# Patient Record
Sex: Female | Born: 1935 | Hispanic: No | Smoking: Never smoker
Health system: Southern US, Community
[De-identification: ages and names within clinical notes are randomized; demographics above are authoritative.]

## PROBLEM LIST (undated history)

## (undated) DIAGNOSIS — M5430 Sciatica, unspecified side: Secondary | ICD-10-CM

## (undated) DIAGNOSIS — G479 Sleep disorder, unspecified: Secondary | ICD-10-CM

## (undated) DIAGNOSIS — M4800 Spinal stenosis, site unspecified: Secondary | ICD-10-CM

## (undated) DIAGNOSIS — E119 Type 2 diabetes mellitus without complications: Secondary | ICD-10-CM

## (undated) DIAGNOSIS — IMO0001 Reserved for inherently not codable concepts without codable children: Secondary | ICD-10-CM

## (undated) DIAGNOSIS — I10 Essential (primary) hypertension: Secondary | ICD-10-CM

## (undated) DIAGNOSIS — K219 Gastro-esophageal reflux disease without esophagitis: Secondary | ICD-10-CM

## (undated) DIAGNOSIS — D649 Anemia, unspecified: Secondary | ICD-10-CM

## (undated) DIAGNOSIS — E785 Hyperlipidemia, unspecified: Secondary | ICD-10-CM

---

## 1979-03-20 HISTORY — PX: ABDOMINAL HYSTERECTOMY: SHX81

## 1999-12-08 ENCOUNTER — Encounter: Payer: Self-pay | Admitting: Family Medicine

## 1999-12-08 ENCOUNTER — Encounter: Admission: RE | Admit: 1999-12-08 | Discharge: 1999-12-08 | Payer: Self-pay | Admitting: Family Medicine

## 2007-10-22 ENCOUNTER — Emergency Department (HOSPITAL_COMMUNITY): Admission: EM | Admit: 2007-10-22 | Discharge: 2007-10-22 | Payer: Self-pay | Admitting: Emergency Medicine

## 2008-10-04 ENCOUNTER — Ambulatory Visit (HOSPITAL_COMMUNITY): Admission: RE | Admit: 2008-10-04 | Discharge: 2008-10-04 | Payer: Self-pay | Admitting: Family Medicine

## 2009-09-15 ENCOUNTER — Encounter: Admission: RE | Admit: 2009-09-15 | Discharge: 2009-09-15 | Payer: Self-pay | Admitting: Family Medicine

## 2009-12-25 ENCOUNTER — Inpatient Hospital Stay (HOSPITAL_COMMUNITY): Admission: EM | Admit: 2009-12-25 | Discharge: 2009-12-27 | Payer: Self-pay | Admitting: Emergency Medicine

## 2010-10-05 LAB — CULTURE, BLOOD (ROUTINE X 2)
Culture: NO GROWTH
Culture: NO GROWTH
Culture: NO GROWTH

## 2010-10-05 LAB — DIFFERENTIAL
Basophils Absolute: 0 10*3/uL (ref 0.0–0.1)
Eosinophils Absolute: 0.1 10*3/uL (ref 0.0–0.7)
Eosinophils Relative: 3 % (ref 0–5)
Lymphocytes Relative: 31 % (ref 12–46)
Lymphocytes Relative: 33 % (ref 12–46)
Lymphs Abs: 2.2 10*3/uL (ref 0.7–4.0)
Lymphs Abs: 2.2 10*3/uL (ref 0.7–4.0)
Monocytes Absolute: 0.6 10*3/uL (ref 0.1–1.0)
Monocytes Relative: 8 % (ref 3–12)
Neutro Abs: 3.7 10*3/uL (ref 1.7–7.7)
Neutro Abs: 4.3 10*3/uL (ref 1.7–7.7)
Neutrophils Relative %: 59 % (ref 43–77)

## 2010-10-05 LAB — COMPREHENSIVE METABOLIC PANEL
AST: 45 U/L — ABNORMAL HIGH (ref 0–37)
Albumin: 3.8 g/dL (ref 3.5–5.2)
BUN: 8 mg/dL (ref 6–23)
Calcium: 9.1 mg/dL (ref 8.4–10.5)
Creatinine, Ser: 0.88 mg/dL (ref 0.4–1.2)
GFR calc Af Amer: >60 mL/min (ref >60)
GFR calc non Af Amer: >60 mL/min (ref >60)
Total Bilirubin: 0.7 mg/dL (ref 0.3–1.2)

## 2010-10-05 LAB — POCT I-STAT, CHEM 8
BUN: 13 mg/dL (ref 6–23)
HCT: 41 % (ref 36.0–46.0)
Sodium: 140 mEq/L (ref 135–145)
TCO2: 28 mmol/L (ref 0–100)

## 2010-10-05 LAB — CBC
HCT: 39.7 % (ref 36.0–46.0)
MCHC: 33.3 g/dL (ref 30.0–36.0)
MCV: 92 fL (ref 78.0–100.0)
MCV: 92.1 fL (ref 78.0–100.0)
Platelets: 274 10*3/uL (ref 150–400)
RBC: 4.27 MIL/uL (ref 3.87–5.11)
RDW: 13.6 % (ref 11.5–15.5)
WBC: 7.2 10*3/uL (ref 4.0–10.5)

## 2010-10-05 LAB — URINALYSIS, ROUTINE W REFLEX MICROSCOPIC
Glucose, UA: NEGATIVE mg/dL
Nitrite: NEGATIVE
Protein, ur: NEGATIVE mg/dL
pH: 6.5 (ref 5.0–8.0)

## 2010-10-05 LAB — MAGNESIUM: Magnesium: 2.1 mg/dL (ref 1.5–2.5)

## 2015-01-10 ENCOUNTER — Other Ambulatory Visit: Payer: Self-pay | Admitting: Physical Medicine and Rehabilitation

## 2015-01-10 DIAGNOSIS — N281 Cyst of kidney, acquired: Secondary | ICD-10-CM

## 2015-01-14 ENCOUNTER — Ambulatory Visit
Admission: RE | Admit: 2015-01-14 | Discharge: 2015-01-14 | Disposition: A | Discharge status: Home / Self Care | Payer: Medicare Other | Source: Ambulatory Visit | Attending: Physical Medicine and Rehabilitation | Admitting: Physical Medicine and Rehabilitation

## 2015-01-14 DIAGNOSIS — N281 Cyst of kidney, acquired: Secondary | ICD-10-CM

## 2015-04-30 ENCOUNTER — Ambulatory Visit: Payer: Self-pay | Admitting: Orthopedic Surgery

## 2015-04-30 NOTE — Progress Notes (Signed)
Needs orders for 10-26 surgery in epic pre op is 10-21 900. thanks

## 2015-05-08 NOTE — Patient Instructions (Addendum)
YOUR PROCEDURE IS SCHEDULED ON : 05/14/15  REPORT TO  HOSPITAL MAIN ENTRANCE FOLLOW SIGNS TO EAST ELEVATOR - GO TO 3rd FLOOR CHECK IN AT 3 EAST NURSES STATION (SHORT STAY) AT:  5:30 AM  CALL THIS NUMBER IF YOU HAVE PROBLEMS THE MORNING OF SURGERY [phone]  REMEMBER:ONLY 1 PER PERSON MAY GO TO SHORT STAY WITH YOU TO GET READY THE MORNING OF YOUR SURGERY  DO NOT EAT FOOD OR DRINK LIQUIDS AFTER MIDNIGHT  TAKE THESE MEDICINES THE MORNING OF SURGERY: NEXIUM  YOU MAY NOT HAVE ANY METAL ON YOUR BODY INCLUDING HAIR PINS AND PIERCING'S. DO NOT WEAR JEWELRY, MAKEUP, LOTIONS, POWDERS OR PERFUMES. DO NOT WEAR NAIL POLISH. DO NOT SHAVE 48 HRS PRIOR TO SURGERY. MEN MAY SHAVE FACE AND NECK.  DO NOT BRING VALUABLES TO HOSPITAL. [name] IS NOT RESPONSIBLE FOR VALUABLES.  CONTACTS, DENTURES OR PARTIALS MAY NOT BE WORN TO SURGERY. LEAVE SUITCASE IN CAR. CAN BE BROUGHT TO ROOM AFTER SURGERY.  PATIENTS DISCHARGED THE DAY OF SURGERY WILL NOT BE ALLOWED TO DRIVE HOME.  PLEASE READ OVER THE FOLLOWING INSTRUCTION SHEETS _________________________________________________________________________________                                          [facility] - PREPARING FOR SURGERY  Before surgery, you can play an important role.  Because skin is not sterile, your skin needs to be as free of germs as possible.  You can reduce the number of germs on your skin by washing with CHG (chlorahexidine gluconate) soap before surgery.  CHG is an antiseptic cleaner which kills germs and bonds with the skin to continue killing germs even after washing. Please DO NOT use if you have an allergy to CHG or antibacterial soaps.  If your skin becomes reddened/irritated stop using the CHG and inform your nurse when you arrive at Short Stay. Do not shave (including legs and underarms) for at least 48 hours prior to the first CHG shower.  You may shave your face. Please follow these instructions  carefully:   1.  Shower with CHG Soap the night before surgery and the  morning of Surgery.   2.  If you choose to wash your hair, wash your hair first as usual with your  normal  Shampoo.   3.  After you shampoo, rinse your hair and body thoroughly to remove the  shampoo.                                         4.  Use CHG as you would any other liquid soap.  You can apply chg directly  to the skin and wash . Gently wash with scrungie or clean wascloth    5.  Apply the CHG Soap to your body ONLY FROM THE NECK DOWN.   Do not use on open                           Wound or open sores. Avoid contact with eyes, ears mouth and genitals (private parts).                        Genitals (private parts) with your normal soap.  6.  Wash thoroughly, paying special attention to the area where your surgery  will be performed.   7.  Thoroughly rinse your body with warm water from the neck down.   8.  DO NOT shower/wash with your normal soap after using and rinsing off  the CHG Soap .                9.  Pat yourself dry with a clean towel.             10.  Wear clean night clothes to bed after shower             11.  Place clean sheets on your bed the night of your first shower and do not  sleep with pets.  Day of Surgery : Do not apply any lotions/deodorants the morning of surgery.  Please wear clean clothes to the hospital/surgery center.  FAILURE TO FOLLOW THESE INSTRUCTIONS MAY RESULT IN THE CANCELLATION OF YOUR SURGERY    PATIENT SIGNATURE_________________________________  ______________________________________________________________________     [name]  An incentive spirometer is a tool that can help keep your lungs clear and active. This tool measures how well you are filling your lungs with each breath. Taking long deep breaths may help reverse or decrease the chance of developing breathing (pulmonary) problems (especially infection) following:  A long  period of time when you are unable to move or be active. BEFORE THE PROCEDURE   If the spirometer includes an indicator to show your best effort, your nurse or respiratory therapist will set it to a desired goal.  If possible, sit up straight or lean slightly forward. Try not to slouch.  Hold the incentive spirometer in an upright position. INSTRUCTIONS FOR USE  1. Sit on the edge of your bed if possible, or sit up as far as you can in bed or on a chair. 2. Hold the incentive spirometer in an upright position. 3. Breathe out normally. 4. Place the mouthpiece in your mouth and seal your lips tightly around it. 5. Breathe in slowly and as deeply as possible, raising the piston or the ball toward the top of the column. 6. Hold your breath for 3-5 seconds or for as long as possible. Allow the piston or ball to fall to the bottom of the column. 7. Remove the mouthpiece from your mouth and breathe out normally. 8. Rest for a few seconds and repeat Steps 1 through 7 at least 10 times every 1-2 hours when you are awake. Take your time and take a few normal breaths between deep breaths. 9. The spirometer may include an indicator to show your best effort. Use the indicator as a goal to work toward during each repetition. 10. After each set of 10 deep breaths, practice coughing to be sure your lungs are clear. If you have an incision (the cut made at the time of surgery), support your incision when coughing by placing a pillow or rolled up towels firmly against it. Once you are able to get out of bed, walk around indoors and cough well. You may stop using the incentive spirometer when instructed by your caregiver.  RISKS AND COMPLICATIONS  Take your time so you do not get dizzy or light-headed.  If you are in pain, you may need to take or ask for pain medication before doing incentive spirometry. It is harder to take a deep breath if you are having pain. AFTER USE  Rest and breathe slowly and  easily.  It can be helpful to keep track of a log of your progress. Your caregiver can provide you with a simple table to help with this. If you are using the spirometer at home, follow these instructions: [place] IF:   You are having difficultly using the spirometer.  You have trouble using the spirometer as often as instructed.  Your pain medication is not giving enough relief while using the spirometer.  You develop fever of 100.5 F (38.1 C) or higher. SEEK IMMEDIATE MEDICAL CARE IF:   You cough up bloody sputum that had not been present before.  You develop fever of 102 F (38.9 C) or greater.  You develop worsening pain at or near the incision site. MAKE SURE YOU:   Understand these instructions.  Will watch your condition.  Will get help right away if you are not doing well or get worse. Document Released: 11/15/2006 Document Revised: 09/27/2011 Document Reviewed: 01/16/2007 [facility] Patient Information 2014 [place], [place].   ________________________________________________________________________

## 2015-05-09 ENCOUNTER — Encounter (HOSPITAL_COMMUNITY)
Admission: RE | Admit: 2015-05-09 | Discharge: 2015-05-09 | Disposition: A | Discharge status: Home / Self Care | Payer: Medicare Other | Source: Ambulatory Visit | Attending: Specialist | Admitting: Specialist

## 2015-05-09 ENCOUNTER — Ambulatory Visit: Payer: Self-pay | Admitting: Orthopedic Surgery

## 2015-05-09 ENCOUNTER — Ambulatory Visit (HOSPITAL_COMMUNITY)
Admission: RE | Admit: 2015-05-09 | Discharge: 2015-05-09 | Disposition: A | Discharge status: Home / Self Care | Payer: Medicare Other | Source: Ambulatory Visit | Attending: Orthopedic Surgery | Admitting: Orthopedic Surgery

## 2015-05-09 ENCOUNTER — Encounter (HOSPITAL_COMMUNITY): Payer: Self-pay

## 2015-05-09 DIAGNOSIS — M8580 Other specified disorders of bone density and structure, unspecified site: Secondary | ICD-10-CM | POA: Diagnosis not present

## 2015-05-09 DIAGNOSIS — M48061 Spinal stenosis, lumbar region without neurogenic claudication: Secondary | ICD-10-CM

## 2015-05-09 DIAGNOSIS — Z01818 Encounter for other preprocedural examination: Secondary | ICD-10-CM | POA: Diagnosis not present

## 2015-05-09 DIAGNOSIS — I700 Atherosclerosis of aorta: Secondary | ICD-10-CM | POA: Insufficient documentation

## 2015-05-09 DIAGNOSIS — Z01812 Encounter for preprocedural laboratory examination: Secondary | ICD-10-CM | POA: Insufficient documentation

## 2015-05-09 DIAGNOSIS — R9431 Abnormal electrocardiogram [ECG] [EKG]: Secondary | ICD-10-CM | POA: Diagnosis not present

## 2015-05-09 DIAGNOSIS — Z0183 Encounter for blood typing: Secondary | ICD-10-CM | POA: Diagnosis not present

## 2015-05-09 DIAGNOSIS — I10 Essential (primary) hypertension: Secondary | ICD-10-CM | POA: Insufficient documentation

## 2015-05-09 DIAGNOSIS — M4806 Spinal stenosis, lumbar region: Secondary | ICD-10-CM | POA: Insufficient documentation

## 2015-05-09 HISTORY — DX: Sleep disorder, unspecified: G47.9

## 2015-05-09 HISTORY — DX: Sciatica, unspecified side: M54.30

## 2015-05-09 HISTORY — DX: Hyperlipidemia, unspecified: E78.5

## 2015-05-09 HISTORY — DX: Type 2 diabetes mellitus without complications: E11.9

## 2015-05-09 HISTORY — DX: Gastro-esophageal reflux disease without esophagitis: K21.9

## 2015-05-09 HISTORY — DX: Reserved for inherently not codable concepts without codable children: IMO0001

## 2015-05-09 HISTORY — DX: Essential (primary) hypertension: I10

## 2015-05-09 HISTORY — DX: Anemia, unspecified: D64.9

## 2015-05-09 HISTORY — DX: Spinal stenosis, site unspecified: M48.00

## 2015-05-09 LAB — SURGICAL PCR SCREEN
MRSA, PCR: NEGATIVE
STAPHYLOCOCCUS AUREUS: NEGATIVE

## 2015-05-09 LAB — BASIC METABOLIC PANEL
ANION GAP: 9 (ref 5–15)
BUN: 11 mg/dL (ref 6–20)
CHLORIDE: 103 mmol/L (ref 101–111)
CO2: 26 mmol/L (ref 22–32)
Calcium: 9.8 mg/dL (ref 8.9–10.3)
Creatinine, Ser: 0.75 mg/dL (ref 0.44–1.00)
GFR calc non Af Amer: >60 mL/min (ref >60)
Glucose, Bld: 116 mg/dL — ABNORMAL HIGH (ref 65–99)
POTASSIUM: 4 mmol/L (ref 3.5–5.1)
SODIUM: 138 mmol/L (ref 135–145)

## 2015-05-09 LAB — ABO/RH: ABO/RH(D): O POS

## 2015-05-09 NOTE — Progress Notes (Signed)
Pt states she was told to "STOP ALL HER MEDICINE A WEEK BEFORE SURGERY" Pt has stopped all meds since 05/06/15  In told pt she needs to continue her Rx meds until day of surgery BMET repeated due to pt not taking metformin x3 days

## 2015-05-09 NOTE — H&P (Signed)
[name] is an 81 y.o. female.   Chief Complaint: back and leg pain HPI: The patient is a 81 year old female who presents with back pain. The patient is here today in referral from Dr. Nelva Bush. The patient reports low back symptoms including pain which began 8 month(s) ago without any known injury. Symptoms are reported to be located in the right low back and Symptoms include pain. The pain radiates to the right buttock, right lateral thigh and right lateral lower leg. The patient describes the pain as aching. The patient describes the severity of their symptoms as 5 / 10. Symptoms are exacerbated by sitting. Symptoms are relieved by recumbency. Current treatment includes nonsteroidal anti-inflammatory drugs (Aleve prn and Gabapentin). Prior to being seen today the patient was previously evaluated by Dr. Delilah Mendez and Dr. Nelva Bush. Past evaluation has included x-ray of the lumbar spine and MRI of the lumbar spine. Past treatment has included nonsteroidal anti-inflammatory drugs, epidural injections (@ L5-S1 right and SNRB L3, L4 right), activity modification, physical therapy, ice packs and heating pad.  CHIEF COMPLAINT Right lower extremity radicular pain, numbness and weakness.  HISTORY OF PRESENT ILLNESS This is a pleasant female, kindly referred by Dr. Nelva Bush for evaluation of the above mentioned symptoms, a 81 year old old. She reports a one year's duration of pain that radiates down into the dorsum of her foot. It is affecting her ability to stand, sit, walk, sleep. She has had an MRI back in June, which indicated stenosis sphering the lateral recess at 4-5 and moderate at 3-4. There is a minimal listhesis of 3 on 4. She had an epidural, no lasting relief. She has undergone physical therapy and has reported weakness and numbness.  The patient's pain drawings are organic and rates the pain currently as 5/10.  Past Medical History  Diagnosis Date  . Hypertension   . Hyperlipidemia   . Shortness of  breath dyspnea     with exertion  . Spinal stenosis   . Sciatic pain   . GERD (gastroesophageal reflux disease)   . Anemia   . Diabetes mellitus without complication ([place])   . Difficulty sleeping     Past Surgical History  Procedure Laterality Date  . Abdominal hysterectomy  1980's    No family history on file. Social History:  reports that she has never smoked. She does not have any smokeless tobacco history on file. She reports that she does not drink alcohol or use illicit drugs.  Allergies: No Known Allergies   (Not in a hospital admission)  Results for orders placed or performed during the hospital encounter of 05/09/15 (from the past 48 hour(s))  Surgical pcr screen     Status: None   Collection Time: 05/09/15 10:05 AM  Result Value Ref Range   MRSA, PCR NEGATIVE NEGATIVE   Staphylococcus aureus NEGATIVE NEGATIVE    Comment:        The Xpert SA Assay (FDA approved for NASAL specimens in patients over 43 years of age), is one component of a comprehensive surveillance program.  Test performance has been validated by [facility] for patients greater than or equal to 93 year old. It is not intended to diagnose infection nor to guide or monitor treatment.   Basic metabolic panel     Status: Abnormal   Collection Time: 05/09/15 10:30 AM  Result Value Ref Range   Sodium 138 135 - 145 mmol/L   Potassium 4.0 3.5 - 5.1 mmol/L   Chloride 103 101 -  111 mmol/L   CO2 26 22 - 32 mmol/L   Glucose, Bld 116 (H) 65 - 99 mg/dL   BUN 11 6 - 20 mg/dL   Creatinine, Ser 0.75 0.44 - 1.00 mg/dL   Calcium 9.8 8.9 - 10.3 mg/dL   GFR calc non Af Amer >60 >60 mL/min   GFR calc Af Amer >60 >60 mL/min    Comment: (NOTE) The eGFR has been calculated using the CKD EPI equation. This calculation has not been validated in all clinical situations. eGFR's persistently <60 mL/min signify possible Chronic Kidney Disease.    Anion gap 9 5 - 15  Type and screen Order type and screen if day  of surgery is less than 15 days from draw of preadmission visit or order morning of surgery if day of surgery is greater than 6 days from preadmission visit.     Status: None   Collection Time: 05/09/15 10:30 AM  Result Value Ref Range   ABO/RH(D) O POS    Antibody Screen NEG    Sample Expiration 05/23/2015   ABO/Rh     Status: None   Collection Time: 05/09/15 10:30 AM  Result Value Ref Range   ABO/RH(D) O POS    Dg Lumbar Spine 2-3 Views  05/09/2015  CLINICAL DATA:  Lumbar spine surgery. EXAM: LUMBAR SPINE - 2-3 VIEW COMPARISON:  CT 12/25/2009. FINDINGS: Diffuse osteopenia. Diffuse multilevel degenerative change. No acute bony abnormality. Normal alignment. Aortoiliac atherosclerotic vascular disease . IMPRESSION: 1. Diffuse degenerative change lumbar spine. No acute abnormality. Normal alignment. 2. Aortoiliac atherosclerotic vascular disease. Electronically Signed   By: Marcello Moores  Register   On: 05/09/2015 12:36    Review of Systems  Constitutional: Negative.   HENT: Negative.   Eyes: Negative.   Respiratory: Negative.   Cardiovascular: Negative.   Gastrointestinal: Negative.   Genitourinary: Negative.   Musculoskeletal: Positive for back pain.  Skin: Negative.   Neurological: Positive for sensory change and focal weakness.    There were no vitals taken for this visit. Physical Exam  Constitutional: She is oriented to person, place, and time. She appears well-developed.  HENT:  Head: Normocephalic.  Eyes: Pupils are equal, round, and reactive to light.  Neck: Normal range of motion.  Cardiovascular: Normal rate.   GI: Soft.  Musculoskeletal:  A healthy female, in moderate distress, sitting and leaning to her left. Straight leg raise, buttock, thigh and calf pain on the right, negative on the left, 4/5 EHL on the right compared to the left, decreased sensation at L5 dermatome, perhaps slightly decreased knee reflex on the right compared to the left. Pain with extension and  flexion of the lumbar spine.  Lumbar spine exam reveals no evidence of soft tissue swelling, deformity or skin ecchymosis. On palpation there is no tenderness of the lumbar spine. No flank pain with percussion. The abdomen is soft and nontender. Nontender over the trochanters. No cellulitis or lymphadenopathy.  Good range of motion of the lumbar spine without associated pain. Motor is 5/5 including tibialis anterior, plantar flexion, quadriceps and hamstrings. Patient is normoreflexic. There is no Babinski or clonus. Patient has good distal pulses. No DVT. No pain and normal range of motion without instability of the hips, knees and ankles.  Neurological: She is alert and oriented to person, place, and time. She has normal reflexes.  Skin: Skin is warm and dry.    Three-view radiographs of the lumbar spine demonstrate disc degeneration at the 3-4 and at 4-5. There is a minimal  listhesis at 3-4 without instability in flexion and extension, perhaps 1 mm. Osteopenia is noted. Hips are unremarkable.  Assessment/Plan 1. 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 2. Non-insulin-dependent diabetes.  Extensive discussion with Mataya and her daughter concerning current pathology, relevant anatomy and treatment options. Certainly, she is significantly symptomatic from her stenosis, predominantly at L5 nerve root distribution. Decompression at 4-5, possibly at 3-4 will be appropriate. I had an extensive discussion of the risks and benefits of the lumbar decompression with the patient including bleeding, infection, damage to neurovascular structures, epidural fibrosis, CSF leak requiring repair. We also discussed increase in pain, adjacent segment disease, recurrent disc herniation, need for future surgery including repeat decompression and/or fusion. We also discussed risks  of postoperative hematoma, paralysis, anesthetic complications including DVT, PE, death, cardiopulmonary dysfunction. In addition, the perioperative and postoperative courses were discussed in detail including the rehabilitative time and return to functional activity and work. I provided the patient with an illustrated handout and utilized the appropriate surgical models. Her diabetes is well controlled, no history of MRSA, DVT, stroke or MI. She is currently taking gabapentin, which is appropriate. We discussed overnight in the hospital, sutures removed in two weeks and full recovery in eight-week period of time following formal supervised physical therapy. I do feel it is reasonable to offer this at this point in time for Ms. Balthazar. She is significantly disabled by her symptoms. I kindly appreciates the referral by Dr. Nelva Bush and we will proceed accordingly. 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  Plan microlumbar decompression L4-5 possible L3-4  BISSELL, JACLYN M. PA-C for Dr. Tonita Cong 05/09/2015, 3:28 PM

## 2015-05-14 ENCOUNTER — Ambulatory Visit (HOSPITAL_COMMUNITY): Payer: Medicare Other

## 2015-05-14 ENCOUNTER — Encounter (HOSPITAL_COMMUNITY)
Admission: RE | Disposition: A | Discharge status: Skilled Nursing Facility | Payer: Self-pay | Source: Ambulatory Visit | Attending: Specialist

## 2015-05-14 ENCOUNTER — Encounter (HOSPITAL_COMMUNITY): Payer: Self-pay | Admitting: *Deleted

## 2015-05-14 ENCOUNTER — Ambulatory Visit (HOSPITAL_COMMUNITY)
Admission: RE | Admit: 2015-05-14 | Discharge: 2015-05-19 | Disposition: A | Discharge status: Skilled Nursing Facility | Payer: Medicare Other | Source: Ambulatory Visit | Attending: Specialist | Admitting: Specialist

## 2015-05-14 ENCOUNTER — Ambulatory Visit (HOSPITAL_COMMUNITY): Payer: Medicare Other | Admitting: Certified Registered Nurse Anesthetist

## 2015-05-14 DIAGNOSIS — E119 Type 2 diabetes mellitus without complications: Secondary | ICD-10-CM | POA: Insufficient documentation

## 2015-05-14 DIAGNOSIS — M25551 Pain in right hip: Secondary | ICD-10-CM | POA: Diagnosis not present

## 2015-05-14 DIAGNOSIS — Z419 Encounter for procedure for purposes other than remedying health state, unspecified: Secondary | ICD-10-CM

## 2015-05-14 DIAGNOSIS — E785 Hyperlipidemia, unspecified: Secondary | ICD-10-CM | POA: Insufficient documentation

## 2015-05-14 DIAGNOSIS — I10 Essential (primary) hypertension: Secondary | ICD-10-CM | POA: Insufficient documentation

## 2015-05-14 DIAGNOSIS — M4806 Spinal stenosis, lumbar region: Secondary | ICD-10-CM | POA: Insufficient documentation

## 2015-05-14 DIAGNOSIS — K219 Gastro-esophageal reflux disease without esophagitis: Secondary | ICD-10-CM | POA: Insufficient documentation

## 2015-05-14 DIAGNOSIS — M48061 Spinal stenosis, lumbar region without neurogenic claudication: Secondary | ICD-10-CM

## 2015-05-14 HISTORY — PX: LUMBAR LAMINECTOMY/DECOMPRESSION MICRODISCECTOMY: SHX5026

## 2015-05-14 LAB — TYPE AND SCREEN
ABO/RH(D): O POS
Antibody Screen: NEGATIVE

## 2015-05-14 LAB — GLUCOSE, CAPILLARY
GLUCOSE-CAPILLARY: 228 mg/dL — AB (ref 65–99)
Glucose-Capillary: 122 mg/dL — ABNORMAL HIGH (ref 65–99)
Glucose-Capillary: 149 mg/dL — ABNORMAL HIGH (ref 65–99)
Glucose-Capillary: 161 mg/dL — ABNORMAL HIGH (ref 65–99)

## 2015-05-14 SURGERY — LUMBAR LAMINECTOMY/DECOMPRESSION MICRODISCECTOMY 2 LEVELS
Anesthesia: General | Site: Back

## 2015-05-14 MED ORDER — CEFAZOLIN SODIUM-DEXTROSE 2-3 GM-% IV SOLR
2.0000 g | Freq: Three times a day (TID) | INTRAVENOUS | Status: AC
  Administered 2015-05-14 – 2015-05-15 (×3): 2 g via INTRAVENOUS
  Filled 2015-05-14 (×3): qty 50

## 2015-05-14 MED ORDER — FENTANYL CITRATE (PF) 100 MCG/2ML IJ SOLN
INTRAMUSCULAR | Status: AC
  Filled 2015-05-14: qty 4

## 2015-05-14 MED ORDER — EPHEDRINE SULFATE 50 MG/ML IJ SOLN
INTRAMUSCULAR | Status: AC
  Filled 2015-05-14: qty 1

## 2015-05-14 MED ORDER — SUGAMMADEX SODIUM 500 MG/5ML IV SOLN: INTRAVENOUS | Status: DC | PRN

## 2015-05-14 MED ORDER — HYDROCODONE-ACETAMINOPHEN 5-325 MG PO TABS
1.0000 | ORAL_TABLET | ORAL | Status: DC | PRN
  Administered 2015-05-14: 1 via ORAL
  Administered 2015-05-14 (×2): 2 via ORAL
  Administered 2015-05-15 – 2015-05-16 (×5): 1 via ORAL
  Administered 2015-05-16: 2 via ORAL
  Administered 2015-05-16: 1 via ORAL
  Administered 2015-05-17 – 2015-05-19 (×9): 2 via ORAL
  Filled 2015-05-14: qty 1
  Filled 2015-05-14: qty 2
  Filled 2015-05-14: qty 1
  Filled 2015-05-14 (×4): qty 2
  Filled 2015-05-14: qty 1
  Filled 2015-05-14: qty 2
  Filled 2015-05-14 (×2): qty 1
  Filled 2015-05-14 (×7): qty 2

## 2015-05-14 MED ORDER — ROCURONIUM BROMIDE 100 MG/10ML IV SOLN
INTRAVENOUS | Status: AC
  Filled 2015-05-14: qty 1

## 2015-05-14 MED ORDER — THROMBIN 5000 UNITS EX SOLR
CUTANEOUS | Status: AC
  Filled 2015-05-14: qty 10000

## 2015-05-14 MED ORDER — DOCUSATE SODIUM 100 MG PO CAPS: 100.0000 mg | ORAL_CAPSULE | Freq: Two times a day (BID) | ORAL | Status: DC | PRN

## 2015-05-14 MED ORDER — POTASSIUM CHLORIDE IN NACL 20-0.9 MEQ/L-% IV SOLN
INTRAVENOUS | Status: AC
  Administered 2015-05-14: 12:00:00 via INTRAVENOUS
  Filled 2015-05-14 (×2): qty 1000

## 2015-05-14 MED ORDER — ONDANSETRON HCL 4 MG/2ML IJ SOLN
INTRAMUSCULAR | Status: DC | PRN
  Administered 2015-05-14 (×2): 2 mg via INTRAVENOUS

## 2015-05-14 MED ORDER — LIDOCAINE HCL (CARDIAC) 20 MG/ML IV SOLN
INTRAVENOUS | Status: AC
  Filled 2015-05-14: qty 5

## 2015-05-14 MED ORDER — PROPOFOL 10 MG/ML IV BOLUS
INTRAVENOUS | Status: AC
  Filled 2015-05-14: qty 20

## 2015-05-14 MED ORDER — ALUM & MAG HYDROXIDE-SIMETH 200-200-20 MG/5ML PO SUSP: 30.0000 mL | Freq: Four times a day (QID) | ORAL | Status: DC | PRN

## 2015-05-14 MED ORDER — CEFAZOLIN SODIUM-DEXTROSE 2-3 GM-% IV SOLR
INTRAVENOUS | Status: AC
Start: 2015-05-14 — End: 2015-05-14
  Filled 2015-05-14: qty 50

## 2015-05-14 MED ORDER — INSULIN ASPART 100 UNIT/ML ~~LOC~~ SOLN
0.0000 [IU] | Freq: Three times a day (TID) | SUBCUTANEOUS | Status: DC
  Administered 2015-05-14: 2 [IU] via SUBCUTANEOUS
  Administered 2015-05-14 – 2015-05-15 (×2): 3 [IU] via SUBCUTANEOUS

## 2015-05-14 MED ORDER — FENTANYL CITRATE (PF) 100 MCG/2ML IJ SOLN
INTRAMUSCULAR | Status: DC | PRN
  Administered 2015-05-14: 25 ug via INTRAVENOUS
  Administered 2015-05-14 (×2): 50 ug via INTRAVENOUS
  Administered 2015-05-14: 25 ug via INTRAVENOUS

## 2015-05-14 MED ORDER — MENTHOL 3 MG MT LOZG: 1.0000 | LOZENGE | OROMUCOSAL | Status: DC | PRN

## 2015-05-14 MED ORDER — ACETAMINOPHEN 325 MG PO TABS
650.0000 mg | ORAL_TABLET | ORAL | Status: DC | PRN
  Administered 2015-05-15: 650 mg via ORAL
  Filled 2015-05-14: qty 2

## 2015-05-14 MED ORDER — FENTANYL CITRATE (PF) 100 MCG/2ML IJ SOLN
25.0000 ug | INTRAMUSCULAR | Status: DC | PRN
  Administered 2015-05-14 (×2): 50 ug via INTRAVENOUS

## 2015-05-14 MED ORDER — ACETAMINOPHEN 650 MG RE SUPP: 650.0000 mg | RECTAL | Status: DC | PRN

## 2015-05-14 MED ORDER — MIDAZOLAM HCL 2 MG/2ML IJ SOLN
INTRAMUSCULAR | Status: AC
  Filled 2015-05-14: qty 4

## 2015-05-14 MED ORDER — METHOCARBAMOL 500 MG PO TABS
500.0000 mg | ORAL_TABLET | Freq: Four times a day (QID) | ORAL | Status: DC | PRN
  Administered 2015-05-14 – 2015-05-19 (×12): 500 mg via ORAL
  Filled 2015-05-14 (×11): qty 1

## 2015-05-14 MED ORDER — SENNOSIDES-DOCUSATE SODIUM 8.6-50 MG PO TABS: 1.0000 | ORAL_TABLET | Freq: Every evening | ORAL | Status: DC | PRN

## 2015-05-14 MED ORDER — HYDROMORPHONE HCL 1 MG/ML IJ SOLN
INTRAMUSCULAR | Status: AC
  Filled 2015-05-14: qty 1

## 2015-05-14 MED ORDER — HYDROMORPHONE HCL 1 MG/ML IJ SOLN
0.5000 mg | INTRAMUSCULAR | Status: DC | PRN
  Administered 2015-05-14: 1 mg via INTRAVENOUS
  Filled 2015-05-14: qty 1

## 2015-05-14 MED ORDER — DEXAMETHASONE SODIUM PHOSPHATE 10 MG/ML IJ SOLN
INTRAMUSCULAR | Status: DC | PRN
  Administered 2015-05-14: 10 mg via INTRAVENOUS

## 2015-05-14 MED ORDER — THROMBIN 5000 UNITS EX SOLR
CUTANEOUS | Status: DC | PRN
  Administered 2015-05-14: 2 [IU] via TOPICAL

## 2015-05-14 MED ORDER — SODIUM CHLORIDE 0.9 % IJ SOLN
INTRAMUSCULAR | Status: AC
  Filled 2015-05-14: qty 10

## 2015-05-14 MED ORDER — LACTATED RINGERS IV SOLN
INTRAVENOUS | Status: DC
  Administered 2015-05-14 (×3): via INTRAVENOUS

## 2015-05-14 MED ORDER — HYDROCODONE-ACETAMINOPHEN 7.5-325 MG PO TABS: 1.0000 | ORAL_TABLET | ORAL | Status: DC | PRN

## 2015-05-14 MED ORDER — DEXAMETHASONE SODIUM PHOSPHATE 10 MG/ML IJ SOLN
INTRAMUSCULAR | Status: AC
  Filled 2015-05-14: qty 1

## 2015-05-14 MED ORDER — OXYCODONE-ACETAMINOPHEN 5-325 MG PO TABS
1.0000 | ORAL_TABLET | ORAL | Status: DC | PRN
  Administered 2015-05-18 – 2015-05-19 (×2): 2 via ORAL
  Filled 2015-05-14 (×2): qty 2

## 2015-05-14 MED ORDER — HYDROMORPHONE HCL 1 MG/ML IJ SOLN
0.2500 mg | INTRAMUSCULAR | Status: DC | PRN
  Administered 2015-05-14 (×2): 0.5 mg via INTRAVENOUS

## 2015-05-14 MED ORDER — PHENOL 1.4 % MT LIQD: 1.0000 | OROMUCOSAL | Status: DC | PRN

## 2015-05-14 MED ORDER — PHENYLEPHRINE HCL 10 MG/ML IJ SOLN
INTRAMUSCULAR | Status: DC | PRN
  Administered 2015-05-14 (×2): 40 ug via INTRAVENOUS
  Administered 2015-05-14 (×3): 20 ug via INTRAVENOUS

## 2015-05-14 MED ORDER — CEFAZOLIN SODIUM-DEXTROSE 2-3 GM-% IV SOLR
2.0000 g | INTRAVENOUS | Status: AC
  Administered 2015-05-14: 2 g via INTRAVENOUS

## 2015-05-14 MED ORDER — ROCURONIUM BROMIDE 100 MG/10ML IV SOLN
INTRAVENOUS | Status: DC | PRN
  Administered 2015-05-14: 35 mg via INTRAVENOUS
  Administered 2015-05-14: 5 mg via INTRAVENOUS

## 2015-05-14 MED ORDER — MIDAZOLAM HCL 5 MG/5ML IJ SOLN
INTRAMUSCULAR | Status: DC | PRN
  Administered 2015-05-14 (×2): 0.5 mg via INTRAVENOUS

## 2015-05-14 MED ORDER — PROMETHAZINE HCL 25 MG/ML IJ SOLN: 6.2500 mg | INTRAMUSCULAR | Status: DC | PRN

## 2015-05-14 MED ORDER — BUPIVACAINE-EPINEPHRINE (PF) 0.5% -1:200000 IJ SOLN
INTRAMUSCULAR | Status: DC | PRN
  Administered 2015-05-14: 10 mL

## 2015-05-14 MED ORDER — SUGAMMADEX SODIUM 500 MG/5ML IV SOLN
INTRAVENOUS | Status: DC | PRN
  Administered 2015-05-14: 200 mg via INTRAVENOUS

## 2015-05-14 MED ORDER — RISAQUAD PO CAPS
1.0000 | ORAL_CAPSULE | Freq: Every day | ORAL | Status: DC
  Administered 2015-05-14 – 2015-05-19 (×6): 1 via ORAL
  Filled 2015-05-14 (×6): qty 1

## 2015-05-14 MED ORDER — GABAPENTIN 100 MG PO CAPS
100.0000 mg | ORAL_CAPSULE | Freq: Three times a day (TID) | ORAL | Status: DC
  Administered 2015-05-14 – 2015-05-19 (×16): 100 mg via ORAL
  Filled 2015-05-14 (×18): qty 1

## 2015-05-14 MED ORDER — MEPERIDINE HCL 50 MG/ML IJ SOLN: 6.2500 mg | INTRAMUSCULAR | Status: DC | PRN

## 2015-05-14 MED ORDER — POLYMYXIN B SULFATE 500000 UNITS IJ SOLR
INTRAMUSCULAR | Status: DC | PRN
  Administered 2015-05-14: 500 mL

## 2015-05-14 MED ORDER — DEXTROSE 5 % IV SOLN
500.0000 mg | Freq: Four times a day (QID) | INTRAVENOUS | Status: DC | PRN
  Administered 2015-05-14: 500 mg via INTRAVENOUS
  Filled 2015-05-14 (×2): qty 5

## 2015-05-14 MED ORDER — FENTANYL CITRATE (PF) 100 MCG/2ML IJ SOLN
INTRAMUSCULAR | Status: AC
  Filled 2015-05-14: qty 2

## 2015-05-14 MED ORDER — PHENYLEPHRINE HCL 10 MG/ML IJ SOLN
INTRAMUSCULAR | Status: AC
  Filled 2015-05-14: qty 1

## 2015-05-14 MED ORDER — HYDROCHLOROTHIAZIDE 25 MG PO TABS
25.0000 mg | ORAL_TABLET | Freq: Every day | ORAL | Status: DC
  Administered 2015-05-15 – 2015-05-19 (×5): 25 mg via ORAL
  Filled 2015-05-14 (×5): qty 1

## 2015-05-14 MED ORDER — BISACODYL 5 MG PO TBEC: 5.0000 mg | DELAYED_RELEASE_TABLET | Freq: Every day | ORAL | Status: DC | PRN

## 2015-05-14 MED ORDER — PROPOFOL 10 MG/ML IV BOLUS
INTRAVENOUS | Status: DC | PRN
  Administered 2015-05-14: 25 mg via INTRAVENOUS
  Administered 2015-05-14: 150 mg via INTRAVENOUS

## 2015-05-14 MED ORDER — DOCUSATE SODIUM 100 MG PO CAPS
100.0000 mg | ORAL_CAPSULE | Freq: Two times a day (BID) | ORAL | Status: DC
  Administered 2015-05-15 – 2015-05-19 (×7): 100 mg via ORAL

## 2015-05-14 MED ORDER — LIDOCAINE HCL (CARDIAC) 20 MG/ML IV SOLN
INTRAVENOUS | Status: DC | PRN
  Administered 2015-05-14: 50 mg via INTRAVENOUS

## 2015-05-14 MED ORDER — MAGNESIUM CITRATE PO SOLN: 1.0000 | Freq: Once | ORAL | Status: DC | PRN

## 2015-05-14 MED ORDER — ONDANSETRON HCL 4 MG/2ML IJ SOLN
INTRAMUSCULAR | Status: AC
  Filled 2015-05-14: qty 2

## 2015-05-14 MED ORDER — SUGAMMADEX SODIUM 200 MG/2ML IV SOLN
INTRAVENOUS | Status: AC
  Filled 2015-05-14: qty 2

## 2015-05-14 MED ORDER — PANTOPRAZOLE SODIUM 40 MG PO TBEC
40.0000 mg | DELAYED_RELEASE_TABLET | Freq: Every day | ORAL | Status: DC
  Administered 2015-05-15 – 2015-05-19 (×5): 40 mg via ORAL
  Filled 2015-05-14 (×5): qty 1

## 2015-05-14 MED ORDER — ONDANSETRON HCL 4 MG/2ML IJ SOLN: 4.0000 mg | INTRAMUSCULAR | Status: DC | PRN

## 2015-05-14 MED ORDER — SODIUM CHLORIDE 0.9 % IR SOLN
Status: AC
  Filled 2015-05-14: qty 1

## 2015-05-14 MED ORDER — BUPIVACAINE-EPINEPHRINE (PF) 0.5% -1:200000 IJ SOLN
INTRAMUSCULAR | Status: AC
Start: 2015-05-14 — End: 2015-05-14
  Filled 2015-05-14: qty 30

## 2015-05-14 SURGICAL SUPPLY — 51 items
BAG SPEC THK2 15X12 ZIP CLS (MISCELLANEOUS)
BAG ZIPLOCK 12X15 (MISCELLANEOUS) IMPLANT
CLEANER TIP ELECTROSURG 2X2 (MISCELLANEOUS) ×3 IMPLANT
CLOSURE WOUND 1/2 X4 (GAUZE/BANDAGES/DRESSINGS)
CLOTH 2% CHLOROHEXIDINE 3PK (PERSONAL CARE ITEMS) ×3 IMPLANT
DRAPE MICROSCOPE LEICA (MISCELLANEOUS) ×3 IMPLANT
DRAPE SHEET LG 3/4 BI-LAMINATE (DRAPES) ×3 IMPLANT
DRAPE SURG 17X11 SM STRL (DRAPES) ×3 IMPLANT
DRAPE UTILITY XL STRL (DRAPES) ×3 IMPLANT
DRSG AQUACEL AG ADV 3.5X 4 (GAUZE/BANDAGES/DRESSINGS) IMPLANT
DRSG AQUACEL AG ADV 3.5X 6 (GAUZE/BANDAGES/DRESSINGS) ×2 IMPLANT
DURAPREP 26ML APPLICATOR (WOUND CARE) ×3 IMPLANT
DURASEAL SPINE SEALANT 3ML (MISCELLANEOUS) IMPLANT
ELECT BLADE TIP CTD 4 INCH (ELECTRODE) ×2 IMPLANT
ELECT REM PT RETURN 9FT ADLT (ELECTROSURGICAL) ×3
ELECTRODE REM PT RTRN 9FT ADLT (ELECTROSURGICAL) ×1 IMPLANT
GLOVE BIOGEL PI IND STRL 7.0 (GLOVE) ×1 IMPLANT
GLOVE BIOGEL PI INDICATOR 7.0 (GLOVE) ×2
GLOVE SURG SS PI 7.0 STRL IVOR (GLOVE) ×3 IMPLANT
GLOVE SURG SS PI 7.5 STRL IVOR (GLOVE) ×3 IMPLANT
GLOVE SURG SS PI 8.0 STRL IVOR (GLOVE) ×6 IMPLANT
GOWN STRL REUS W/TWL XL LVL3 (GOWN DISPOSABLE) ×6 IMPLANT
IV CATH 14GX2 1/4 (CATHETERS) IMPLANT
KIT BASIN OR (CUSTOM PROCEDURE TRAY) ×3 IMPLANT
KIT POSITIONING SURG ANDREWS (MISCELLANEOUS) ×3 IMPLANT
MANIFOLD NEPTUNE II (INSTRUMENTS) ×3 IMPLANT
NDL SPNL 18GX3.5 QUINCKE PK (NEEDLE) ×2 IMPLANT
NEEDLE SPNL 18GX3.5 QUINCKE PK (NEEDLE) ×6 IMPLANT
PACK LAMINECTOMY ORTHO (CUSTOM PROCEDURE TRAY) ×3 IMPLANT
PATTIES SURGICAL .5 X.5 (GAUZE/BANDAGES/DRESSINGS) ×1 IMPLANT
PATTIES SURGICAL .75X.75 (GAUZE/BANDAGES/DRESSINGS) ×3 IMPLANT
PATTIES SURGICAL 1X1 (DISPOSABLE) IMPLANT
PEN SKIN MARKING BROAD (MISCELLANEOUS) ×3 IMPLANT
RUBBERBAND STERILE (MISCELLANEOUS) ×3 IMPLANT
SPONGE LAP 4X18 X RAY DECT (DISPOSABLE) ×2 IMPLANT
SPONGE SURGIFOAM ABS GEL 100 (HEMOSTASIS) ×3 IMPLANT
STAPLER VISISTAT (STAPLE) ×4 IMPLANT
STRIP CLOSURE SKIN 1/2X4 (GAUZE/BANDAGES/DRESSINGS) IMPLANT
SUT NURALON 4 0 TR CR/8 (SUTURE) IMPLANT
SUT PROLENE 3 0 PS 2 (SUTURE) IMPLANT
SUT VIC AB 1 CT1 27 (SUTURE)
SUT VIC AB 1 CT1 27XBRD ANTBC (SUTURE) IMPLANT
SUT VIC AB 1-0 CT2 27 (SUTURE) IMPLANT
SUT VIC AB 2-0 CT1 27 (SUTURE)
SUT VIC AB 2-0 CT1 TAPERPNT 27 (SUTURE) IMPLANT
SUT VIC AB 2-0 CT2 27 (SUTURE) IMPLANT
SYR 3ML LL SCALE MARK (SYRINGE) IMPLANT
TOWEL OR 17X26 10 PK STRL BLUE (TOWEL DISPOSABLE) ×3 IMPLANT
TOWEL OR NON WOVEN STRL DISP B (DISPOSABLE) ×3 IMPLANT
TRAY FOLEY W/METER SILVER 14FR (SET/KITS/TRAYS/PACK) ×2 IMPLANT
YANKAUER SUCT BULB TIP NO VENT (SUCTIONS) ×3 IMPLANT

## 2015-05-14 NOTE — Anesthesia Preprocedure Evaluation (Addendum)
Anesthesia Evaluation  Patient identified by MRN, date of birth, ID band Patient awake    Reviewed: Allergy & Precautions, NPO status , Patient's Chart, lab work & pertinent test results  Airway Mallampati: II  TM Distance: >3 FB Neck ROM: Full    Dental no notable dental hx. (+) Edentulous Upper, Edentulous Lower   Pulmonary neg pulmonary ROS,    Pulmonary exam normal breath sounds clear to auscultation       Cardiovascular hypertension, Pt. on medications Normal cardiovascular exam Rhythm:Regular Rate:Normal     Neuro/Psych negative neurological ROS  negative psych ROS   GI/Hepatic negative GI ROS, Neg liver ROS,   Endo/Other  diabetes, Type 2, Oral Hypoglycemic Agents  Renal/GU negative Renal ROS  negative genitourinary   Musculoskeletal negative musculoskeletal ROS (+)   Abdominal   Peds negative pediatric ROS (+)  Hematology negative hematology ROS (+)   Anesthesia Other Findings   Reproductive/Obstetrics negative OB ROS                            Anesthesia Physical Anesthesia Plan  ASA: III  Anesthesia Plan: General   Post-op Pain Management:    Induction: Intravenous  Airway Management Planned: Oral ETT  Additional Equipment:   Intra-op Plan:   Post-operative Plan: Extubation in OR  Informed Consent: I have reviewed the patients History and Physical, chart, labs and discussed the procedure including the risks, benefits and alternatives for the proposed anesthesia with the patient or authorized representative who has indicated his/her understanding and acceptance.   Dental advisory given  Plan Discussed with: CRNA  Anesthesia Plan Comments:         Anesthesia Quick Evaluation

## 2015-05-14 NOTE — Evaluation (Signed)
Physical Therapy Evaluation Patient Details Name: [name] MRN: [id number] DOB: 1933/12/17 Today's Date: 05/14/2015   History of Present Illness  MICRO LUMBAR DECOMPRESSION L4-L5 POSSIBLE L3-L4 ( 2 LEVELS) 10-26  Clinical Impression  Patient  Tolerated mobilizing to edge of bed, required 2 persons for safe anmbulation  X 15'. C/o R htigh spasms. Patient will benefit from PT to address problems listed in the note below.    Follow Up Recommendations Home health PT;Supervision/Assistance - 24 hour    Equipment Recommendations  Rolling walker with 5" wheels;3in1 (PT)    Recommendations for Other Services       Precautions / Restrictions Precautions Precautions: Back      Mobility  Bed Mobility Overal bed mobility: Needs Assistance;+ 2 for safety/equipment Bed Mobility: Rolling;Sidelying to Sit;Sit to Sidelying Rolling: Mod assist Sidelying to sit: Max assist     Sit to sidelying: Mod assist;HOB elevated General bed mobility comments: cues for bend knees and og roll, bed pad used to roll. assist for trunk to upright, asist with legs onto bed and trunk to sidelying.  Transfers Overall transfer level: Needs assistance Equipment used: Rolling walker (2 wheeled) Transfers: Sit to/from Stand Sit to Stand: +2 physical assistance;+2 safety/equipment;From elevated surface         General transfer comment: assist to rise  from bed, cues for hand placement  attempted x 2 , knees are weak, c/o pain in the back of the R thigh  Ambulation/Gait Ambulation/Gait assistance: +2 physical assistance;+2 safety/equipment;Mod assist Ambulation Distance (Feet): 15 Feet Assistive device: Rolling walker (2 wheeled) Gait Pattern/deviations: Step-to pattern     General Gait Details: legs buckling, cues for safety with RW.  Stairs            Wheelchair Mobility    Modified Rankin (Stroke Patients Only)       Balance                                              Pertinent Vitals/Pain Pain Assessment: 0-10 Pain Score: 7  Pain Location: from a 3 to a 7 R posterior leg Pain Descriptors / Indicators: Discomfort;Dull;Spasm;Stabbing;Radiating Pain Intervention(s): Limited activity within patient's tolerance;Repositioned;Patient requesting pain meds-RN notified    Home Living Family/patient expects to be discharged to:: Private residence Living Arrangements: Children;Other relatives Available Help at Discharge: Family Type of Home: Apartment Home Access: Stairs to enter Entrance Stairs-Rails: Doctor, general practice of Steps: 6   Home Equipment: None      Prior Function Level of Independence: Independent               Hand Dominance        Extremity/Trunk Assessment   Upper Extremity Assessment: Defer to OT evaluation           Lower Extremity Assessment: RLE deficits/detail RLE Deficits / Details: c/o numbness in the lower leg and opost thigh    Cervical / Trunk Assessment: Normal  Communication   Communication: No difficulties  Cognition Arousal/Alertness: Awake/alert Behavior During Therapy: WFL for tasks assessed/performed;Anxious Overall Cognitive Status: Within Functional Limits for tasks assessed                      General Comments      Exercises        Assessment/Plan    PT Assessment Patient needs continued PT services  PT  Diagnosis Difficulty walking;Acute pain   PT Problem List Decreased strength;Decreased activity tolerance;Decreased mobility;Decreased knowledge of use of DME;Decreased safety awareness;Decreased knowledge of precautions;Cardiopulmonary status limiting activity;Pain  PT Treatment Interventions DME instruction;Gait training;Stair training;Functional mobility training;Therapeutic activities;Therapeutic exercise;Patient/family education   PT Goals (Current goals can be found in the Care Plan section) Acute Rehab PT Goals Patient Stated Goal: to walk PT Goal  Formulation: With patient/family Time For Goal Achievement: 05/21/15 Potential to Achieve Goals: Good    Frequency Min 5X/week   Barriers to discharge        Co-evaluation               End of Session Equipment Utilized During Treatment: Gait belt Activity Tolerance: Patient limited by fatigue;Patient limited by pain Patient left: in bed;with call bell/phone within reach;with family/visitor present Nurse Communication: Mobility status    Functional Assessment Tool Used: clinical judgement Functional Limitation: Mobility: Walking and moving around Mobility: Walking and Moving Around Current Status ([id number](G8978): At least 60 percent but less than 80 percent impaired, limited or restricted Mobility: Walking and Moving Around Goal Status [phone](G8979): At least 1 percent but less than 20 percent impaired, limited or restricted    Time: [id number] PT Time Calculation (min) (ACUTE ONLY): 28 min   Charges:   PT Evaluation $Initial PT Evaluation Tier I: 1 Procedure PT Treatments $Gait Training: 8-22 mins   PT G Codes:   PT G-Codes **NOT FOR INPATIENT CLASS** Functional Assessment Tool Used: clinical judgement Functional Limitation: Mobility: Walking and moving around Mobility: Walking and Moving Around Current Status ([id number](G8978): At least 60 percent but less than 80 percent impaired, limited or restricted Mobility: Walking and Moving Around Goal Status [phone](G8979): At least 1 percent but less than 20 percent impaired, limited or restricted    Rada HayHill, Geraldean Walen Elizabeth 05/14/2015, 4:17 PM

## 2015-05-14 NOTE — H&P (View-Only) (Signed)
[name] is an 81 y.o. female.   Chief Complaint: back and leg pain HPI: The patient is a 81 year old female who presents with back pain. The patient is here today in referral from Dr. Nelva Bush. The patient reports low back symptoms including pain which began 8 month(s) ago without any known injury. Symptoms are reported to be located in the right low back and Symptoms include pain. The pain radiates to the right buttock, right lateral thigh and right lateral lower leg. The patient describes the pain as aching. The patient describes the severity of their symptoms as 5 / 10. Symptoms are exacerbated by sitting. Symptoms are relieved by recumbency. Current treatment includes nonsteroidal anti-inflammatory drugs (Aleve prn and Gabapentin). Prior to being seen today the patient was previously evaluated by Dr. Delilah Shan and Dr. Nelva Bush. Past evaluation has included x-ray of the lumbar spine and MRI of the lumbar spine. Past treatment has included nonsteroidal anti-inflammatory drugs, epidural injections (@ L5-S1 right and SNRB L3, L4 right), activity modification, physical therapy, ice packs and heating pad.  CHIEF COMPLAINT Right lower extremity radicular pain, numbness and weakness.  HISTORY OF PRESENT ILLNESS This is a pleasant female, kindly referred by Dr. Nelva Bush for evaluation of the above mentioned symptoms, a 81 year old old. She reports a one year's duration of pain that radiates down into the dorsum of Martha foot. It is affecting Martha ability to stand, sit, walk, sleep. She has had an MRI back in June, which indicated stenosis sphering the lateral recess at 4-5 and moderate at 3-4. There is a minimal listhesis of 3 on 4. She had an epidural, no lasting relief. She has undergone physical therapy and has reported weakness and numbness.  The patient's pain drawings are organic and rates the pain currently as 5/10.  Past Medical History  Diagnosis Date  . Hypertension   . Hyperlipidemia   . Shortness of  breath dyspnea     with exertion  . Spinal stenosis   . Sciatic pain   . GERD (gastroesophageal reflux disease)   . Anemia   . Diabetes mellitus without complication ([place])   . Difficulty sleeping     Past Surgical History  Procedure Laterality Date  . Abdominal hysterectomy  1980's    No family history on file. Social History:  reports that she has never smoked. She does not have any smokeless tobacco history on file. She reports that she does not drink alcohol or use illicit drugs.  Allergies: No Known Allergies   (Not in a hospital admission)  Results for orders placed or performed during the hospital encounter of 05/09/15 (from the past 48 hour(s))  Surgical pcr screen     Status: None   Collection Time: 05/09/15 10:05 AM  Result Value Ref Range   MRSA, PCR NEGATIVE NEGATIVE   Staphylococcus aureus NEGATIVE NEGATIVE    Comment:        The Xpert SA Assay (FDA approved for NASAL specimens in patients over 23 years of age), is one component of a comprehensive surveillance program.  Test performance has been validated by [facility] for patients greater than or equal to 69 year old. It is not intended to diagnose infection nor to guide or monitor treatment.   Basic metabolic panel     Status: Abnormal   Collection Time: 05/09/15 10:30 AM  Result Value Ref Range   Sodium 138 135 - 145 mmol/L   Potassium 4.0 3.5 - 5.1 mmol/L   Chloride 103 101 -  111 mmol/L   CO2 26 22 - 32 mmol/L   Glucose, Bld 116 (H) 65 - 99 mg/dL   BUN 11 6 - 20 mg/dL   Creatinine, Ser 0.75 0.44 - 1.00 mg/dL   Calcium 9.8 8.9 - 10.3 mg/dL   GFR calc non Af Amer >60 >60 mL/min   GFR calc Af Amer >60 >60 mL/min    Comment: (NOTE) The eGFR has been calculated using the CKD EPI equation. This calculation has not been validated in all clinical situations. eGFR's persistently <60 mL/min signify possible Chronic Kidney Disease.    Anion gap 9 5 - 15  Type and screen Order type and screen if day  of surgery is less than 15 days from draw of preadmission visit or order morning of surgery if day of surgery is greater than 6 days from preadmission visit.     Status: None   Collection Time: 05/09/15 10:30 AM  Result Value Ref Range   ABO/RH(D) O POS    Antibody Screen NEG    Sample Expiration 05/23/2015   ABO/Rh     Status: None   Collection Time: 05/09/15 10:30 AM  Result Value Ref Range   ABO/RH(D) O POS    Dg Lumbar Spine 2-3 Views  05/09/2015  CLINICAL DATA:  Lumbar spine surgery. EXAM: LUMBAR SPINE - 2-3 VIEW COMPARISON:  CT 12/25/2009. FINDINGS: Diffuse osteopenia. Diffuse multilevel degenerative change. No acute bony abnormality. Normal alignment. Aortoiliac atherosclerotic vascular disease . IMPRESSION: 1. Diffuse degenerative change lumbar spine. No acute abnormality. Normal alignment. 2. Aortoiliac atherosclerotic vascular disease. Electronically Signed   By: Marcello Moores  Register   On: 05/09/2015 12:36    Review of Systems  Constitutional: Negative.   HENT: Negative.   Eyes: Negative.   Respiratory: Negative.   Cardiovascular: Negative.   Gastrointestinal: Negative.   Genitourinary: Negative.   Musculoskeletal: Positive for back pain.  Skin: Negative.   Neurological: Positive for sensory change and focal weakness.    There were no vitals taken for this visit. Physical Exam  Constitutional: She is oriented to person, place, and time. She appears well-developed.  HENT:  Head: Normocephalic.  Eyes: Pupils are equal, round, and reactive to light.  Neck: Normal range of motion.  Cardiovascular: Normal rate.   GI: Soft.  Musculoskeletal:  A healthy female, in moderate distress, sitting and leaning to Martha left. Straight leg raise, buttock, thigh and calf pain on the right, negative on the left, 4/5 EHL on the right compared to the left, decreased sensation at L5 dermatome, perhaps slightly decreased knee reflex on the right compared to the left. Pain with extension and  flexion of the lumbar spine.  Lumbar spine exam reveals no evidence of soft tissue swelling, deformity or skin ecchymosis. On palpation there is no tenderness of the lumbar spine. No flank pain with percussion. The abdomen is soft and nontender. Nontender over the trochanters. No cellulitis or lymphadenopathy.  Good range of motion of the lumbar spine without associated pain. Motor is 5/5 including tibialis anterior, plantar flexion, quadriceps and hamstrings. Patient is normoreflexic. There is no Babinski or clonus. Patient has good distal pulses. No DVT. No pain and normal range of motion without instability of the hips, knees and ankles.  Neurological: She is alert and oriented to person, place, and time. She has normal reflexes.  Skin: Skin is warm and dry.    Three-view radiographs of the lumbar spine demonstrate disc degeneration at the 3-4 and at 4-5. There is a minimal  listhesis at 3-4 without instability in flexion and extension, perhaps 1 mm. Osteopenia is noted. Hips are unremarkable.  Assessment/Plan 1. 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 2. Non-insulin-dependent diabetes.  Extensive discussion with [name] and [name] concerning current pathology, relevant anatomy and treatment options. Certainly, she is significantly symptomatic from Martha stenosis, predominantly at L5 nerve root distribution. Decompression at 4-5, possibly at 3-4 will be appropriate. I had an extensive discussion of the risks and benefits of the lumbar decompression with the patient including bleeding, infection, damage to neurovascular structures, epidural fibrosis, CSF leak requiring repair. We also discussed increase in pain, adjacent segment disease, recurrent disc herniation, need for future surgery including repeat decompression and/or fusion. We also discussed risks  of postoperative hematoma, paralysis, anesthetic complications including DVT, PE, death, cardiopulmonary dysfunction. In addition, the perioperative and postoperative courses were discussed in detail including the rehabilitative time and return to functional activity and work. I provided the patient with an illustrated handout and utilized the appropriate surgical models. Martha diabetes is well controlled, no history of MRSA, DVT, stroke or MI. She is currently taking gabapentin, which is appropriate. We discussed overnight in the hospital, sutures removed in two weeks and full recovery in eight-week period of time following formal supervised physical therapy. I do feel it is reasonable to offer this at this point in time for [name]. She is significantly disabled by Martha symptoms. I kindly appreciates the referral by Dr. Nelva Bush and we will proceed accordingly. 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  Plan microlumbar decompression L4-5 possible L3-4  Sanja Elizardo M. PA-C for Dr. Tonita Cong 05/09/2015, 3:28 PM

## 2015-05-14 NOTE — Interval H&P Note (Signed)
History and Physical Interval Note:  05/14/2015 7:32 AM  [name]  has presented today for surgery, with the diagnosis of STENOSIS L4-L5 AND L3-L4   The various methods of treatment have been discussed with the patient and family. After consideration of risks, benefits and other options for treatment, the patient has consented to  Procedure(s): MICRO LUMBAR DECOMPRESSION L4-L5 POSSIBLE L3-L4 ( 2 LEVELS) (N/A) as a surgical intervention .  The patient's history has been reviewed, patient examined, no change in status, stable for surgery.  I have reviewed the patient's chart and labs.  Questions were answered to the patient's satisfaction.     Jaydy Fitzhenry C

## 2015-05-14 NOTE — Transfer of Care (Signed)
Immediate Anesthesia Transfer of Care Note  Patient: [name]  Procedure(s) Performed: Procedure(s): MICRO LUMBAR DECOMPRESSION L4-L5 POSSIBLE L3-L4 ( 2 LEVELS) (N/A)  Patient Location: PACU  Anesthesia Type:General  Level of Consciousness: awake, oriented, patient cooperative, lethargic and responds to stimulation  Airway & Oxygen Therapy: Patient Spontanous Breathing and Patient connected to face mask oxygen  Post-op Assessment: Report given to RN, Post -op Vital signs reviewed and stable and Patient moving all extremities  Post vital signs: Reviewed and stable  Last Vitals:  Filed Vitals:   05/14/15 0542  BP: 156/71  Pulse: 94  Temp: 36.7 C  Resp: 18    Complications: No apparent anesthesia complications

## 2015-05-14 NOTE — Anesthesia Procedure Notes (Signed)
Procedure Name: Intubation Date/Time: 05/14/2015 7:39 AM Performed by: Edison PaceGRAY, Merrianne Mccumbers E Pre-anesthesia Checklist: Patient identified, Emergency Drugs available, Suction available, Patient being monitored and Timeout performed Patient Re-evaluated:Patient Re-evaluated prior to inductionOxygen Delivery Method: Circle system utilized Preoxygenation: Pre-oxygenation with 100% oxygen Intubation Type: IV induction Ventilation: Mask ventilation without difficulty Laryngoscope Size: Mac and 4 Grade View: Grade I Tube type: Oral Tube size: 7.5 mm Number of attempts: 1 Airway Equipment and Method: Stylet Placement Confirmation: ETT inserted through vocal cords under direct vision,  positive ETCO2 and breath sounds checked- equal and bilateral Secured at: 20 cm Tube secured with: Tape Dental Injury: Teeth and Oropharynx as per pre-operative assessment

## 2015-05-14 NOTE — Discharge Instructions (Signed)

## 2015-05-14 NOTE — Brief Op Note (Signed)
05/14/2015  9:19 AM  PATIENT:  [name]  81 y.o. female  PRE-OPERATIVE DIAGNOSIS:  STENOSIS L4-L5 AND L3-L4   POST-OPERATIVE DIAGNOSIS:  STENOSIS L4-L5 AND L3-L4   PROCEDURE:  Procedure(s): MICRO LUMBAR DECOMPRESSION L4-L5 POSSIBLE L3-L4 ( 2 LEVELS) (N/A)  SURGEON:  Surgeon(s) and Role:    * Jene EveryJeffrey Ryver Poblete, MD - Primary  PHYSICIAN ASSISTANT:   ASSISTANTS: [place]   ANESTHESIA:   general  EBL:  Total I/O In: 1000 [I.V.:1000] Out: 165 [Urine:115; Blood:50]  BLOOD ADMINISTERED:none  DRAINS: none   LOCAL MEDICATIONS USED:  MARCAINE     SPECIMEN:  No Specimen  DISPOSITION OF SPECIMEN:  N/A  COUNTS:  YES  TOURNIQUET:  * No tourniquets in log *  DICTATION: .Other Dictation: Dictation Number [id number]  PLAN OF CARE: Admit for overnight observation  PATIENT DISPOSITION:  PACU - hemodynamically stable.   Delay start of Pharmacological VTE agent (>24hrs) due to surgical blood loss or risk of bleeding: yes

## 2015-05-14 NOTE — Anesthesia Postprocedure Evaluation (Signed)
  Anesthesia Post-op Note  Patient: [name]  Procedure(s) Performed: Procedure(s) (LRB): MICRO LUMBAR DECOMPRESSION L4-L5 POSSIBLE L3-L4 ( 2 LEVELS) (N/A)  Patient Location: PACU  Anesthesia Type: General  Level of Consciousness: awake and alert   Airway and Oxygen Therapy: Patient Spontanous Breathing  Post-op Pain: mild  Post-op Assessment: Post-op Vital signs reviewed, Patient's Cardiovascular Status Stable, Respiratory Function Stable, Patent Airway and No signs of Nausea or vomiting  Last Vitals:  Filed Vitals:   05/14/15 1224  BP: 159/74  Pulse: 84  Temp: 36.4 C  Resp: 16    Post-op Vital Signs: stable   Complications: No apparent anesthesia complications

## 2015-05-15 DIAGNOSIS — M4806 Spinal stenosis, lumbar region: Secondary | ICD-10-CM | POA: Diagnosis not present

## 2015-05-15 LAB — BASIC METABOLIC PANEL
Anion gap: 9 (ref 5–15)
BUN: 14 mg/dL (ref 6–20)
CHLORIDE: 102 mmol/L (ref 101–111)
CO2: 28 mmol/L (ref 22–32)
Calcium: 9.4 mg/dL (ref 8.9–10.3)
Creatinine, Ser: 0.96 mg/dL (ref 0.44–1.00)
GFR calc Af Amer: >60 mL/min (ref >60)
GFR calc non Af Amer: 55 mL/min — ABNORMAL LOW (ref >60)
Glucose, Bld: 159 mg/dL — ABNORMAL HIGH (ref 65–99)
POTASSIUM: 5 mmol/L (ref 3.5–5.1)
Sodium: 139 mmol/L (ref 135–145)

## 2015-05-15 LAB — GLUCOSE, CAPILLARY
GLUCOSE-CAPILLARY: 118 mg/dL — AB (ref 65–99)
GLUCOSE-CAPILLARY: 133 mg/dL — AB (ref 65–99)
GLUCOSE-CAPILLARY: 152 mg/dL — AB (ref 65–99)
Glucose-Capillary: 155 mg/dL — ABNORMAL HIGH (ref 65–99)

## 2015-05-15 MED ORDER — DIPHENHYDRAMINE HCL 25 MG PO CAPS
25.0000 mg | ORAL_CAPSULE | Freq: Four times a day (QID) | ORAL | Status: DC | PRN
  Administered 2015-05-15: 25 mg via ORAL
  Filled 2015-05-15: qty 1

## 2015-05-15 MED ORDER — ASPIRIN 325 MG PO TABS: 325.0000 mg | ORAL_TABLET | Freq: Every day | ORAL | Status: DC

## 2015-05-15 NOTE — Op Note (Signed)
[name], [name]                   ACCOUNT NO.:  [id number]  MEDICAL RECORD NO.:  [id number]  LOCATION:  1614                         FACILITY:  [facility]  PHYSICIAN:  Martha Every, M.D.    DATE OF BIRTH:  September 19, 1933  DATE OF PROCEDURE:  05/14/2015 DATE OF DISCHARGE:                              OPERATIVE REPORT   PREOPERATIVE DIAGNOSIS:  Spinal stenosis 4-5, 3-4.  POSTOPERATIVE DIAGNOSIS:  Spinal stenosis 4-5, 3-4.  PROCEDURE PERFORMED:  Microlumbar decompression, L4-5, L3-4 right with foraminotomies of L4 and L5, right.  ANESTHESIA:  General.  ASSISTANT:  Lanna Poche, PA.  HISTORY:  This is a 81 year old female with severe right lower extremity radicular pain, L5-L4 nerve root distribution.  Had MRI indicating spinal stenosis, severe in the lateral recess at 4-5 secondary to facet hypertrophy and neuroforaminal stenosis.  She had some stenosis at 3-4. There was minimal listhesis at 3-4.  No back pain.  No instability on flexion, extension, EHL weakness, quad weakness, neural tension signs, indicated for decompression failing conservative treatment.  Risks and benefits were discussed including bleeding, infection, damage to neurovascular structures, DVT, PE, anesthetic complications, etc.  No changes in symptoms, worsening symptoms.  TECHNIQUE:  With the patient in a supine position, after the induction of adequate general anesthesia, 2 g Kefzol, placed prone on the [place] frame.  All bony prominences were well padded and lumbar region was prepped and draped in usual sterile fashion.  Two 18-gauge spinal needles were utilized to localize 4-5 interspace, confirmed with x-ray. Incision was made from spinous process of 3 to below 5.  Subcutaneous tissue was dissected.  Electrocautery was utilized to achieve hemostasis.  Dorsolumbar fascia was identified, divided in line of skin incision.  Paraspinous muscle elevated from lamina of 3-4 and 4-5 on the right.  The patient  had an absent interlaminar window at 4-5 on the right due to the facet hypertrophy.  In an effort to not sacrifice the facet, I chose to proceed with central decompression at 4-5. Paraspinous muscle elevated from lamina of 3-4 and 4-5 on the left as well.  McCullough retractor was placed after confirmatory radiograph obtained.  Very small interlaminar window was noted on the left as well. I therefore performed a central decompression at 4-5 removing the spinous process of 4 and then used a 2-mm Kerrison, performed hemilaminotomy of the caudad edge of 5 on the right and centrally.  We continued cephalad.  There was severe hypertrophic facet on the right compressing the 5 root in the lateral recess.  There was stenosis of the foramen of 4 as well in the lateral recess in the foramen of 5. Hemilaminotomy of the cephalad edge of 5 was performed with a 3-mm Kerrison, performed a generous foraminotomy of 5 on the right.  Neural element was protected at all times with neuro patties.  Decompressed the lateral recess in the medial border of pedicle undercutting the facet on the right.  Approximately 25% of the medial aspect of the facet was removed.  To perform foraminotomy of 4, we required dissection of the hemi-lamina of 4 on the right and ligamentum flavum from the lateral recess at 3-4.  I preserved the facet and the neural arch of 3, no instability with testing of 3-4 motion segment.  Decompressed on the opposite side of the operating room table as well.  The neural probe passed freely at the foramen of 4 and 5 on the left.  We decompressed centrally.  Good restoration of the thecal sac particularly in the lateral recess at 4-5.  Confirmatory radiograph obtained with a marker in the foramen of 5 and at 2-3.  Good restoration of thecal sac.  No evidence of CSF leakage or active bleeding.  We used bone wax on the cancellous surfaces.  Bipolar cautery utilized to achieve hemostasis as well as  0.25% Marcaine with epinephrine in the periarticular tissues. The McCullough retractor was removed.  We irrigated the paraspinous musculature.  No active bleeding.  Dorsolumbar fascia was reapproximated with #1 Vicryl interrupted figure-of-eight sutures, subcu with 2-0, and skin with staples.  Wound dressed sterilely.  Placed supine on the hospital bed, extubated without difficulty, and transported to the recovery room in satisfactory condition.  Prior to that, after I shaved the bone, I felt it was not necessary to perform lateral recess or lateral mass fusion at 3-4.  She was transported to the recovery room in satisfactory condition.  No complications.  Blood loss was 50 mL.     [name], M.D.     Cordelia PenJB/MEDQ  D:  05/14/2015  T:  05/15/2015  Job:  [id number]

## 2015-05-15 NOTE — Progress Notes (Signed)
Physical Therapy Treatment Patient Details Name: Martha CabalLela B Kyte MRN: [id number] DOB: 02/02/1934 Today's Date: 05/15/2015    History of Present Illness s/p micro lumbar decompression L3-4 and L4-5    PT Comments    Patient is very unsteady, R Leg is spasming and buckling.Patient will benefit from HHPT  Follow Up Recommendations  Home health PT;Supervision/Assistance - 24 hour     Equipment Recommendations  Rolling walker with 5" wheels;3in1 (PT)    Recommendations for Other Services       Precautions / Restrictions Precautions Precautions: Back;Fall Precaution Comments: reviewed back precautions Restrictions Weight Bearing Restrictions: No    Mobility  Bed Mobility     Rolling: Mod assist Sidelying to sit: Mod assist       General bed mobility comments: in recliner  Transfers Overall transfer level: Needs assistance Equipment used: Rolling walker (2 wheeled) Transfers: Sit to/from Stand Sit to Stand: Min assist;Mod assist         General transfer comment: multimodal cues for safety , pushing off of the recliner., 2 attempts to stand , reports spasms of R  leg, Leg buckled and patient sat back down., this occurred for each attempt to stand,  Ambulation/Gait Ambulation/Gait assistance: +2 safety/equipment;Mod assist Ambulation Distance (Feet): 15 Feet (x2) Assistive device: Rolling walker (2 wheeled) Gait Pattern/deviations: Step-to pattern;Antalgic   Gait velocity interpretation: Below normal speed for age/gender General Gait Details: legs buckling, cues for safety with RW. very unsafe ambulation due to R leg buckling.   Stairs            Wheelchair Mobility    Modified Rankin (Stroke Patients Only)       Balance Overall balance assessment: Needs assistance         Standing balance support: During functional activity;Bilateral upper extremity supported Standing balance-Leahy Scale: Poor Standing balance comment: due to the r leg  buckling.                    Cognition Arousal/Alertness: Awake/alert Behavior During Therapy: Anxious;WFL for tasks assessed/performed Overall Cognitive Status: Within Functional Limits for tasks assessed                      Exercises      General Comments        Pertinent Vitals/Pain Pain Assessment: Faces Pain Score: 7  Faces Pain Scale: Hurts whole lot Pain Location: R leg Pain Descriptors / Indicators: Spasm;Cramping Pain Intervention(s): Premedicated before session;Repositioned    Home Living Family/patient expects to be discharged to:: Private residence Living Arrangements: Children;Other relatives Available Help at Discharge: Family Type of Home: Apartment       Home Equipment: Shower seat      Prior Function Level of Independence: Independent          PT Goals (current goals can now be found in the care plan section) Acute Rehab PT Goals Patient Stated Goal: to be as independent as possible Progress towards PT goals: Progressing toward goals    Frequency  Min 5X/week    PT Plan Current plan remains appropriate    Co-evaluation             End of Session Equipment Utilized During Treatment: Gait belt Activity Tolerance: No increased pain Patient left: in chair     Time: 1202-1219 PT Time Calculation (min) (ACUTE ONLY): 17 min  Charges:  $Gait Training: 8-22 mins  G Codes:      Rada Hay 05/15/2015, 1:37 PM

## 2015-05-15 NOTE — Evaluation (Signed)
Occupational Therapy Evaluation Patient Details Name: [name] MRN: [id number] DOB: 06/11/1934 Today's Date: 05/15/2015    History of Present Illness s/p micro lumbar decompression L3-4 and L4-5   Clinical Impression   This 81 year old female was admitted for the above surgery.  She will benefit from skilled OT to increase safety and independence with adls and reinforce back precautions. Pt needs up to max A for LB adls.  Goals in acute are for supervision to min A level.    Follow Up Recommendations  Supervision/Assistance - 24 hour    Equipment Recommendations  3 in 1 bedside comode    Recommendations for Other Services       Precautions / Restrictions Precautions Precautions: Back Restrictions Weight Bearing Restrictions: No      Mobility Bed Mobility     Rolling: Mod assist Sidelying to sit: Mod assist       General bed mobility comments: cues for technique and assistance to keep spine in alignment: utilized pad to assist hips  Transfers   Equipment used: Rolling walker (2 wheeled) Transfers: Sit to/from Stand Sit to Stand: Min assist;From elevated surface         General transfer comment: cues for back precautions, powering up with legs and hand placement    Balance                                            ADL Overall ADL's : Needs assistance/impaired     Grooming: Supervision/safety;Set up;Sitting   Upper Body Bathing: Supervision/ safety;Set up;Sitting   Lower Body Bathing: Moderate assistance;Sit to/from stand   Upper Body Dressing : Set up;Sitting   Lower Body Dressing: Maximal assistance;Sit to/from stand   Toilet Transfer: Minimal assistance;Stand-pivot;RW (to recliner)   Toileting- Clothing Manipulation and Hygiene: Moderate assistance;Sit to/from stand         General ADL Comments: reviewed back precautions and ADLs.  Pt will have 24/7 but she is very independent. She is interested in getting "depends"  garments on independently.  She may switch to a tab closure system.  Will show toilet aide on next visit.  Educated family about trifolding a sheet under pt to assist with rolling.  Pt had 2 cramps/spasms during bed mobility.  Pt will sponge bathe initially; reviewed stepping over tub when pt is ready (sidestepping)     Vision     Perception     Praxis      Pertinent Vitals/Pain Pain Assessment: Faces Faces Pain Scale: Hurts whole lot Pain Location: back Pain Descriptors / Indicators: Spasm;Cramping Pain Intervention(s): Limited activity within patient's tolerance;Monitored during session;Premedicated before session;Repositioned     Hand Dominance     Extremity/Trunk Assessment Upper Extremity Assessment Upper Extremity Assessment: Overall WFL for tasks assessed           Communication Communication Communication: No difficulties   Cognition Arousal/Alertness: Awake/alert Behavior During Therapy: WFL for tasks assessed/performed;Anxious Overall Cognitive Status: Within Functional Limits for tasks assessed                     General Comments       Exercises       Shoulder Instructions      Home Living Family/patient expects to be discharged to:: Private residence Living Arrangements: Children;Other relatives Available Help at Discharge: Family Type of Home: Apartment  Bathroom Shower/Tub: Chief Strategy OfficerTub/shower unit   Bathroom Toilet: Standard     Home Equipment: Information systems managerhower seat          Prior Functioning/Environment Level of Independence: Independent             OT Diagnosis: Generalized weakness   OT Problem List: Decreased strength;Decreased activity tolerance;Decreased knowledge of use of DME or AE;Decreased knowledge of precautions;Pain   OT Treatment/Interventions: Self-care/ADL training;DME and/or AE instruction;Balance training;Patient/family education    OT Goals(Current goals can be found in the care plan section) Acute Rehab  OT Goals Patient Stated Goal: to be as independent as possible OT Goal Formulation: With patient Time For Goal Achievement: 05/22/15 Potential to Achieve Goals: Good ADL Goals Pt Will Perform Grooming: with supervision;standing Pt Will Transfer to Toilet: with min guard assist;ambulating;bedside commode Additional ADL Goal #1: pt will follow back precautions with no more than one cue during adls Additional ADL Goal #2: pt will perform bed mobility with min A following back precautions in preparation for toilet transfers Additional ADL Goal #3: pt will don depends garments with reacher and set up and verbalize toilet aide  OT Frequency: Min 2X/week   Barriers to D/C:            Co-evaluation              End of Session    Activity Tolerance: Patient tolerated treatment well Patient left: in chair;with call bell/phone within reach;with family/visitor present   Time: [id number] OT Time Calculation (min): 32 min Charges:  OT General Charges $OT Visit: 1 Procedure OT Evaluation $Initial OT Evaluation Tier I: 1 Procedure OT Treatments $Self Care/Home Management : 8-22 mins G-Codes: OT G-codes **NOT FOR INPATIENT CLASS** Functional Assessment Tool Used: clinical judgment Functional Limitation: Self care Self Care Current Status ([id number]): At least 60 percent but less than 80 percent impaired, limited or restricted Self Care Goal Status (J1914(G8988): At least 40 percent but less than 60 percent impaired, limited or restricted  [facility] CenterENCER,Selena Swaminathan 05/15/2015, 11:35 AM  Marica OtterMaryellen Michelina Mexicano, OTR/L [phone] 05/15/2015

## 2015-05-15 NOTE — Care Management Note (Signed)
Case Management Note  Patient Details  Name: KASSONDRA GEIL MRN: [id number] Date of Birth: [birth year]  Subjective/Objective:                   MICRO LUMBAR DECOMPRESSION L4-L5 POSSIBLE L3-L4 ( 2 LEVELS) (N/A) Action/Plan: Discharge planning  Expected Discharge Date:  05/15/15               Expected Discharge Plan:  [place]  In-House Referral:     Discharge planning Services  CM Consult  Post Acute Care Choice:  Home Health Choice offered to:  Patient  DME Arranged:  3-N-1 DME Agency:  [place]:  PT [facility] Agency:  [place]  Status of Service:  In process, will continue to follow  Medicare Important Message Given:    Date Medicare IM Given:    Medicare IM give by:    Date Additional Medicare IM Given:    Additional Medicare Important Message give by:     If discussed at [place] of Stay Meetings, dates discussed:    Additional Comments: CM met with pt in room to offer choice of home health agency.  Pt chooses [facility] to render HHPT.  Address and contact information verified by pt.  Pt has a rolling walker but needs a 3n1.  CM Called [facility] DME rep to please deliver the 3n1 prior to discharge tomorrow.  Referral for HHPT called to [facility] rep, Stephanie.  CM has requested HHPT order, 3n1 order and face to face.  CM will follow to ensure orders and F2F are placed. Dellie Catholic, RN 05/15/2015, 12:10 PM

## 2015-05-16 DIAGNOSIS — M4806 Spinal stenosis, lumbar region: Secondary | ICD-10-CM | POA: Diagnosis not present

## 2015-05-16 LAB — CBC
HEMATOCRIT: 33.5 % — AB (ref 36.0–46.0)
Hemoglobin: 10.8 g/dL — ABNORMAL LOW (ref 12.0–15.0)
MCH: 29.7 pg (ref 26.0–34.0)
MCHC: 32.2 g/dL (ref 30.0–36.0)
MCV: 92 fL (ref 78.0–100.0)
Platelets: 333 10*3/uL (ref 150–400)
RBC: 3.64 MIL/uL — ABNORMAL LOW (ref 3.87–5.11)
RDW: 14.2 % (ref 11.5–15.5)
WBC: 12.7 10*3/uL — ABNORMAL HIGH (ref 4.0–10.5)

## 2015-05-16 LAB — BASIC METABOLIC PANEL
Anion gap: 8 (ref 5–15)
BUN: 16 mg/dL (ref 6–20)
CALCIUM: 9.8 mg/dL (ref 8.9–10.3)
CO2: 29 mmol/L (ref 22–32)
CREATININE: 0.95 mg/dL (ref 0.44–1.00)
Chloride: 101 mmol/L (ref 101–111)
GFR calc Af Amer: >60 mL/min (ref >60)
GFR calc non Af Amer: 55 mL/min — ABNORMAL LOW (ref >60)
GLUCOSE: 176 mg/dL — AB (ref 65–99)
Potassium: 4.1 mmol/L (ref 3.5–5.1)
Sodium: 138 mmol/L (ref 135–145)

## 2015-05-16 LAB — GLUCOSE, CAPILLARY
GLUCOSE-CAPILLARY: 141 mg/dL — AB (ref 65–99)
Glucose-Capillary: 129 mg/dL — ABNORMAL HIGH (ref 65–99)
Glucose-Capillary: 154 mg/dL — ABNORMAL HIGH (ref 65–99)

## 2015-05-16 NOTE — Progress Notes (Addendum)
Physical Therapy Treatment Patient Details Name: [name] MRN: [id number] DOB: [birth year] Today's Date: 05/16/2015    History of Present Illness s/p micro lumbar decompression L3-4 and L4-5    PT Comments    Patient requires extensive assistance of 2 persons to pivot to BSC/recliner. Unable to safely ambulate this am with much encouragement. Patient has 6 STE her home. Patient may benefit from Short term snf if eligible. Currently not functioning at a level that family can care for  The patient.   Follow Up Recommendations  Home health PT;Supervision/Assistance - 24 hour;SNF may be beneficial.     Equipment Recommendations  Rolling walker with 5" wheels;3in1 (PT)    Recommendations for Other Services       Precautions / Restrictions Precautions Precautions: Back;Fall Precaution Comments: reviewed back precautions, patient recalls none Restrictions Weight Bearing Restrictions: No    Mobility  Bed Mobility     Rolling: Max assist       Sit to sidelying: Max assist;HOB elevated General bed mobility comments: extra time to roll, is  guarding and stops, tries to roll back. Multimodal cues for technique. HOB raised to assist patient to sit up from L sidelying. Patient requires extra time.  Transfers Overall transfer level: Needs assistance Equipment used: Rolling walker (2 wheeled) Transfers: Sit to/from [employer] Sit to Stand: Max assist;+2 physical assistance;+2 safety/equipment;From elevated surface Stand pivot transfers: Max assist;+2 physical assistance;+2 safety/equipment       General transfer comment: multimodal cues for safety , pushing off of the bed, used BSC armrest and 1 side of the  RW, patient wanted to do as she  wanted inspite  of cues for safety. Multiple attempts to stand from the bed raised, strong cues to keep moving to turn to the [facility]. Used RW  and constant cues to  pivot to recliner. appears to have spasms, patient  reports spasms  of R  leg, R leg buckles with a spasms.  Ambulation/Gait             General Gait Details: unable today. encouraged patient to try. She declined and asked for [facility] to  be placed closer.   Stairs            Wheelchair Mobility    Modified Rankin (Stroke Patients Only)       Balance           Standing balance support: During functional activity;Bilateral upper extremity supported Standing balance-Leahy Scale: Poor                      Cognition   Behavior During Therapy: Anxious;Restless;Impulsive                        Exercises      General Comments        Pertinent Vitals/Pain Pain Score: 8  Pain Location: R thigh and back Pain Descriptors / Indicators: Spasm;Cramping;Grimacing;Guarding Pain Intervention(s): Premedicated before session;Ice applied;Limited activity within patient's tolerance;Monitored during session    Home Living                      Prior Function            PT Goals (current goals can now be found in the care plan section) Progress towards PT goals: Not progressing toward goals - comment (patient reports that spasms of r leg are limiting.)    Frequency  7X/week  PT Plan Current plan remains appropriate;Discharge plan needs to be updated;Frequency needs to be updated (may need snf if progress is slow . patient does have family support.)    Co-evaluation             End of Session Equipment Utilized During Treatment: Gait belt Activity Tolerance: Patient limited by pain Patient left: in chair;with call bell/phone within reach;with family/visitor present     Time: 1000-1035 PT Time Calculation (min) (ACUTE ONLY): 35 min  Charges:  $Therapeutic Activity: 23-37 mins                    G Codes:      Rada HayHill, [name] 05/16/2015, 10:50 AM  Blanchard KelchKaren Ryder Ches[place] PT [phone]

## 2015-05-16 NOTE — Progress Notes (Signed)
Physical Therapy Treatment Patient Details Name: [name] MRN: [id number] DOB: 10/08/1933 Today's Date: 05/16/2015    History of Present Illness s/p micro lumbar decompression L3-4 and L4-5    PT Comments    Patient did ambulate a short distance this session but continues to require extensive assist and time for safety and to  Mobilize. Noted decreased  Weight on R leg, will stop and hold the leg flexed and reports that  Leg is having  Spasm.  Follow Up Recommendations  Home health PT;Supervision/Assistance - 24 hour;SNF     Equipment Recommendations  Rolling walker with 5" wheels;3in1 (PT)    Recommendations for Other Services       Precautions / Restrictions Precautions Precautions: Back;Fall Precaution Comments: reviewed back precautions, patient recalls none    Mobility  Bed Mobility   Bed Mobility: Sit to Sidelying Rolling: Max assist       Sit to sidelying: Mod assist;+2 for physical assistance;+2 for safety/equipment General bed mobility comments: cues for sequence, back precautions.  guided trunk and assisted bil LEs.    Transfers Overall transfer level: Needs assistance Equipment used: Rolling walker (2 wheeled) Transfers: Sit to/from Stand Sit to Stand: Mod assist;+2 physical assistance;+2 safety/equipment Stand pivot transfers: Max assist;+2 physical assistance;+2 safety/equipment       General transfer comment: cues for hand placement, assist to rise and stabilize  Ambulation/Gait Ambulation/Gait assistance: Mod assist;+2 physical assistance;+2 safety/equipment Ambulation Distance (Feet): 10 Feet (x2) Assistive device: Rolling walker (2 wheeled) Gait Pattern/deviations: Step-to pattern;Shuffle;Antalgic;Decreased stance time - right     General Gait Details: patient did  ambulate 10', with extra time, patient stops but much better tolerated, R leg continues to buckle/spasm.   Stairs            Wheelchair Mobility    Modified Rankin  (Stroke Patients Only)       Balance           Standing balance support: During functional activity;Bilateral upper extremity supported Standing balance-Leahy Scale: Poor                      Cognition Arousal/Alertness: Awake/alert Behavior During Therapy: Anxious Overall Cognitive Status: Within Functional Limits for tasks assessed                      Exercises      General Comments        Pertinent Vitals/Pain Pain Score: 6  Pain Location: R thigh Pain Descriptors / Indicators: Spasm Pain Intervention(s): Limited activity within patient's tolerance;Monitored during session;Premedicated before session;Repositioned;Ice applied    Home Living                      Prior Function            PT Goals (current goals can now be found in the care plan section) Progress towards PT goals: Progressing toward goals    Frequency  7X/week    PT Plan Current plan remains appropriate    Co-evaluation PT/OT/SLP Co-Evaluation/Treatment: Yes Reason for Co-Treatment: For patient/therapist safety PT goals addressed during session: Mobility/safety with mobility OT goals addressed during session: ADL's and self-care     End of Session Equipment Utilized During Treatment: Gait belt Activity Tolerance: Patient limited by pain Patient left: with call bell/phone within reach;with family/visitor present;in bed     Time: [id number] PT Time Calculation (min) (ACUTE ONLY): 26 min  Charges:  $Gait Training: 8-22 mins $Therapeutic  Activity: 23-37 mins                    G Codes:      Rada Hay 05/16/2015, 2:07 PM Blanchard Kelch PT [phone]

## 2015-05-16 NOTE — Progress Notes (Signed)
Subjective: 2 Days Post-Op Procedure(s) (LRB): MICRO LUMBAR DECOMPRESSION L4-L5 POSSIBLE L3-L4 ( 2 LEVELS) (N/A) Patient reports pain as moderate.   Pt reports numbness R leg, weakness, feeling like the R leg is buckling under her when up and walking. She is on the commode this morning during my exam. She feels "ready to pass out" and is feeling hot. She received Norco for pain, Robaxin overnight. No N/V. She is nervous about going home especially by herself. Asking if she needs to go somewhere for PT.  Objective: Vital signs in last 24 hours: Temp:  [97.6 F (36.4 C)-99.3 F (37.4 C)] 99.3 F (37.4 C) (10/28 0509) Pulse Rate:  [78-85] 78 (10/28 0509) Resp:  [16-18] 18 (10/28 0509) BP: (139-148)/(50-75) 144/75 mmHg (10/28 0509) SpO2:  [98 %-100 %] 99 % (10/28 0509)  Intake/Output from previous day: 10/27 0701 - 10/28 0700 In: 1620 [P.O.:1620] Out: 4175 [Urine:4175] Intake/Output this shift:    No results for input(s): HGB in the last 72 hours. No results for input(s): WBC, RBC, HCT, PLT in the last 72 hours.  Recent Labs  05/15/15 0542  NA 139  K 5.0  CL 102  CO2 28  BUN 14  CREATININE 0.96  GLUCOSE 159*  CALCIUM 9.4   No results for input(s): LABPT, INR in the last 72 hours.  Neurologically intact ABD soft Neurovascular intact Sensation intact distally Intact pulses distally Dorsiflexion/Plantar flexion intact Incision: dressing C/D/I and no drainage No cellulitis present Compartment soft no sign of DVT  Assessment/Plan: 2 Days Post-Op Procedure(s) (LRB): MICRO LUMBAR DECOMPRESSION L4-L5 POSSIBLE L3-L4 ( 2 LEVELS) (N/A) Advance diet Up with therapy D/C IV fluids  Discussed Lspine precautions, D/C instructions Would recommend gradually increasing walking program as able Will need an assistive device due to weakness RLE- would likely do better with a walker than a cane for more support We have ordered HHPT and her family lives nextdoor, they can be  available to help her with ADLs as she recovers. Expect time for RLE numbness and strength to improve as nerve heals. Plan D/C to home with HHPT when ready- possibly later today vs. Tomorrow. Will discuss with Dr. Shelle IronBeane this AM and he will re-evaluate later today.  BISSELL, JACLYN M. 05/16/2015, 8:12 AM

## 2015-05-16 NOTE — Progress Notes (Signed)
Patient ID: [name], female   DOB: 04/23/1934, 81 y.o.   MRN: [id number] Seen by Dr. Shelle IronBeane in rounds POD #1 s/p lumbar decompression AM Noting improvement in LE pain, c/o incisional pain in lower back Voiding without difficulty Granddaughter here with her  She does have family nextdoor to help her She does not feel ready for D/C on POD #1 due to pain  On exam dressing clean and dry Pedal pulses intact Distal sensation intact No sign of DVT Abdomen soft, nondistended Compartments soft   Impression s/p lumbar decompression  Plan Pain control PT for ambulation Plan D/C tomorrow POD #2 as long as doing well with PT

## 2015-05-16 NOTE — Progress Notes (Signed)
Occupational Therapy Treatment Patient Details Name: Martha CabalLela B Leasure MRN: [id number] DOB: 09/22/1933 Today's Date: 05/16/2015    History of present illness s/p micro lumbar decompression L3-4 and L4-5   OT comments  Pt needed more assistance today than upon evaluation.  Would benefit from SNF as she will only have one caregiver available after this weekend.  Pt's RLE started to buckle a couple of times  Follow Up Recommendations  SNF    Equipment Recommendations  3 in 1 bedside comode    Recommendations for Other Services      Precautions / Restrictions Precautions Precautions: Back;Fall Precaution Comments: reviewed back precautions, patient recalls none       Mobility Bed Mobility     Rolling: Max assist       Sit to sidelying: Mod assist;+2 for physical assistance;+2 for safety/equipment General bed mobility comments: cues for sequence, back precautions.  guided trunk and assisted bil LEs.    Transfers Overall transfer level: Needs assistance Equipment used: Rolling walker (2 wheeled) Transfers: Sit to/from Stand Sit to Stand: Mod assist;+2 physical assistance;+2 safety/equipment        General transfer comment: cues for hand placement, assist to rise and stabilize    Balance           Standing balance support: During functional activity;Bilateral upper extremity supported Standing balance-Leahy Scale: Poor                     ADL Overall ADL's : Needs assistance/impaired                         Toilet Transfer: Moderate assistance;+2 for physical assistance;+2 for safety/equipment;Ambulation;BSC;RW   Toileting- Clothing Manipulation and Hygiene: Total assistance;+2 for safety/equipment;+2 for physical assistance;Sit to/from stand         General ADL Comments: [facility] feet [facility] and then back to bed.  Pt's RLE started to buckle a couple of times.  Educated on toilet aide: pt needs bil UEs to support herself in standing at this  time      Vision                     Perception     Praxis      Cognition   Behavior During Therapy: [facility] for tasks assessed/performed Overall Cognitive Status: Within Functional Limits for tasks assessed                       Extremity/Trunk Assessment               Exercises     Shoulder Instructions       General Comments      Pertinent Vitals/ Pain       Pain Score: 6  Pain Location: R thigh Pain Descriptors / Indicators: Aching Pain Intervention(s): Limited activity within patient's tolerance;Monitored during session;Premedicated before session;Repositioned;Ice applied  Home Living                                          Prior Functioning/Environment              Frequency Min 2X/week     Progress Toward Goals  OT Goals(current goals can now be found in the care plan section)  Progress towards OT goals: Not progressing toward goals - comment;OT to reassess  next treatment     Plan Discharge plan needs to be updated    Co-evaluation    PT/OT/SLP Co-Evaluation/Treatment: Yes Reason for Co-Treatment: For patient/therapist safety PT goals addressed during session: Mobility/safety with mobility OT goals addressed during session: ADL's and self-care      End of Session     Activity Tolerance Patient limited by fatigue;Patient limited by pain   Patient Left in bed;with call bell/phone within reach;with family/visitor present;with bed alarm set   Nurse Communication          Time: [id number] OT Time Calculation (min): 26 min  Charges: OT General Charges $OT Visit: 1 Procedure OT Treatments $Self Care/Home Management : 8-22 mins  Soila Printup 05/16/2015, 12:58 PM  Marica Otter, OTR/L [phone] 05/16/2015

## 2015-05-17 DIAGNOSIS — M4806 Spinal stenosis, lumbar region: Secondary | ICD-10-CM | POA: Diagnosis not present

## 2015-05-17 LAB — GLUCOSE, CAPILLARY
GLUCOSE-CAPILLARY: 147 mg/dL — AB (ref 65–99)
GLUCOSE-CAPILLARY: 187 mg/dL — AB (ref 65–99)
GLUCOSE-CAPILLARY: 154 mg/dL — AB (ref 65–99)
GLUCOSE-CAPILLARY: 160 mg/dL — AB (ref 65–99)

## 2015-05-17 NOTE — Progress Notes (Signed)
   Subjective: 3 Days Post-Op Procedure(s) (LRB): MICRO LUMBAR DECOMPRESSION L4-L5 POSSIBLE L3-L4 ( 2 LEVELS) (N/A) Patient reports pain as mild.   Patient seen in rounds with Dr. Lequita HaltAluisio.  She is feeling better but not walking a lot yet. Patient is doing better. Patient is ready to go home if she does well with walking.  Will see how she does with therapy today  Objective: Vital signs in last 24 hours: Temp:  [98.4 F (36.9 C)-99.2 F (37.3 C)] 98.4 F (36.9 C) (10/29 0540) Pulse Rate:  [85-101] 85 (10/29 0540) Resp:  [18-20] 20 (10/29 0540) BP: (146-162)/(55-67) 162/67 mmHg (10/29 0540) SpO2:  [97 %-100 %] 99 % (10/29 0540)  Intake/Output from previous day:  Intake/Output Summary (Last 24 hours) at 05/17/15 0716 Last data filed at 05/17/15 0541  Gross per 24 hour  Intake   1200 ml  Output   2100 ml  Net   -900 ml    Labs:  Recent Labs  05/16/15 0955  HGB 10.8*    Recent Labs  05/16/15 0955  WBC 12.7*  RBC 3.64*  HCT 33.5*  PLT 333    Recent Labs  05/15/15 0542 05/16/15 0955  NA 139 138  K 5.0 4.1  CL 102 101  CO2 28 29  BUN 14 16  CREATININE 0.96 0.95  GLUCOSE 159* 176*  CALCIUM 9.4 9.8   No results for input(s): LABPT, INR in the last 72 hours.  EXAM: General - Patient is Alert, Appropriate and Oriented Extremity - Neurovascular intact Sensation intact distally Incision - clean, dry, no drainage Motor Function - intact, moving foot and toes well on exam.   Assessment/Plan: 3 Days Post-Op Procedure(s) (LRB): MICRO LUMBAR DECOMPRESSION L4-L5 POSSIBLE L3-L4 ( 2 LEVELS) (N/A) Procedure(s) (LRB): MICRO LUMBAR DECOMPRESSION L4-L5 POSSIBLE L3-L4 ( 2 LEVELS) (N/A) Past Medical History  Diagnosis Date  . Hypertension   . Hyperlipidemia   . Shortness of breath dyspnea     with exertion  . Spinal stenosis   . Sciatic pain   . GERD (gastroesophageal reflux disease)   . Anemia   . Diabetes mellitus without complication (HCC)   . Difficulty  sleeping    Principal Problem:   Spinal stenosis of lumbar region  Estimated body mass index is 33.45 kg/(m^2) as calculated from the following:   Height as of this encounter: 5\' 5"  (1.651 m).   Weight as of this encounter: 91.173 kg (201 lb). Up with therapy Discharge home Diet - Cardiac diet and Diabetic diet Follow up - in 2 weeks Activity - WBAT Disposition - Home Condition Upon Discharge - Stable D/C Meds - See DC Summary  Avel Peacerew Perkins, PA-C Orthopaedic Surgery 05/17/2015, 7:16 AM

## 2015-05-17 NOTE — Progress Notes (Signed)
Occupational Therapy Treatment Patient Details Name: Martha CabalLela B Mendez MRN: [id number] DOB: 09/05/1933 Today's Date: 05/17/2015    History of present illness s/p micro lumbar decompression L3-4 and L4-5   OT comments  Pt moving slowly and continues to require +2 to safely perform functional tranfers and ADL. Pt limited by pain and needs constant reminders for back precautions. At this time, recommend SNF. Informed nursing of pt's difficulty with functional tasks/mobility.   Follow Up Recommendations  SNF;Supervision/Assistance - 24 hour    Equipment Recommendations  3 in 1 bedside comode    Recommendations for Other Services      Precautions / Restrictions Precautions Precautions: Back;Fall Precaution Comments: reviewed back precautions, patient recalls 2/3 with prompting Restrictions Weight Bearing Restrictions: No       Mobility Bed Mobility Overal bed mobility: Needs Assistance Bed Mobility: Rolling;Sidelying to Sit Rolling: Mod assist Sidelying to sit: Mod assist;+2 for physical assistance;+2 for safety/equipment       General bed mobility comments: Multimodal cues for safety, adherence to back precautions, technique, hand placement. Assist for trunk and bil LEs. Utilized bedpad. Increased time-pt tends to stop mid movement  Transfers Overall transfer level: Needs assistance Equipment used: Rolling walker (2 wheeled) Transfers: Sit to/from Stand Sit to Stand: Mod assist;+2 physical assistance;+2 safety/equipment;From elevated surface Stand pivot transfers: Min assist;+2 physical assistance;+2 safety/equipment       General transfer comment: Assist to rise, stabilize, control descent. Multimodal cues for posture, safety, adherence to precautions, hand placement. Increased time.     Balance           Standing balance support: Bilateral upper extremity supported;During functional activity Standing balance-Leahy Scale: Poor                     ADL                            Toilet Transfer: +2 for physical assistance;+2 for safety/equipment;Moderate assistance   Toileting- Clothing Manipulation and Hygiene: +2 for physical assistance;+2 for safety/equipment;Total assistance;Sit to/from stand         General ADL Comments: Pt able to state 2/3 back precautions with ? cues. Reviewed all back precautions. Required much increased time for all activity. Educated again on toilet aid option and demonstrated toilet aid. Also informed pt's daughter where she can obtain toilet aid and explained that pt would definitely benefit from it. Pt currently still not able to let go of walker and maintain balance to be able to use UEs in functional activity such as wiping herself. After tranfer off of BSC, pt took a few steps but then stated she had to sit down in recliner.       Vision                     Perception     Praxis      Cognition   Behavior During Therapy: Anxious Overall Cognitive Status: Within Functional Limits for tasks assessed                       Extremity/Trunk Assessment               Exercises     Shoulder Instructions       General Comments      Pertinent Vitals/ Pain       Pain Assessment: 0-10 Pain Score: 9  Pain Location: back, R LE Pain  Descriptors / Indicators: Spasm;Sharp;Aching;Sore;Grimacing;Guarding Pain Intervention(s): Limited activity within patient's tolerance;Repositioned;Premedicated before session  Home Living                                          Prior Functioning/Environment              Frequency Min 2X/week     Progress Toward Goals  OT Goals(current goals can now be found in the care plan section)  Progress towards OT goals: Not progressing toward goals - comment;OT to reassess next treatment     Plan Discharge plan remains appropriate    Co-evaluation    PT/OT/SLP Co-Evaluation/Treatment: Yes Reason for Co-Treatment: For  patient/therapist safety PT goals addressed during session: Mobility/safety with mobility OT goals addressed during session: ADL's and self-care;Proper use of Adaptive equipment and DME      End of Session Equipment Utilized During Treatment: Rolling walker   Activity Tolerance Patient limited by fatigue;Patient limited by pain   Patient Left in chair;with call bell/phone within reach   Nurse Communication          Time: 1610-9604 OT Time Calculation (min): 28 min  Charges: OT General Charges $OT Visit: 1 Procedure OT Treatments $Therapeutic Activity: 8-22 mins  Lennox Laity  [phone] 05/17/2015, 1:03 PM

## 2015-05-17 NOTE — Progress Notes (Signed)
Per Avel Peacerew Perkins PA, patient will remain in the hospital and may be discharged tomorrow depending on how she does with PT. If she continues to not progress, will look into SNF placement.

## 2015-05-17 NOTE — Progress Notes (Signed)
[facility]. According to PT, patient is not ready to be discharged to home today. She was unable to fully participate with PT, only ambulating a few steps. Will await return phone call.

## 2015-05-17 NOTE — Progress Notes (Signed)
Physical Therapy Treatment Patient Details Name: [name] MRN: [id number] DOB: 07/04/1934 Today's Date: 05/17/2015    History of Present Illness s/p micro lumbar decompression L3-4 and L4-5    PT Comments    Pt continues to require +2 assist for all mobility tasks. Max cues required during session for adherence to back and safety precautions. Pt was only able to take ~3 steps this session before requesting to stop and sit in recliner. Need to consider ST rehab at [facility].   Follow Up Recommendations  SNF;Supervision/Assistance - 24 hour (HHPT if pt progresses well enough to d/c home with 24 hour care)  Will likely require ambulance transport if pt discharges home.   Equipment Recommendations  Rolling walker with 5" wheels    Recommendations for Other Services       Precautions / Restrictions Precautions Precautions: Back;Fall Precaution Comments: reviewed back precautions, patient recalls 2/3 with prompting Restrictions Weight Bearing Restrictions: No    Mobility  Bed Mobility Overal bed mobility: Needs Assistance Bed Mobility: Rolling;Sidelying to Sit Rolling: Mod assist Sidelying to sit: Mod assist;+2 for physical assistance;+2 for safety/equipment       General bed mobility comments: Multimodal cues for safety, adherence to back precautions, technique, hand placement. Assist for trunk and bil LEs. Utilized bedpad. Increased time-pt tends to stop mid movement  Transfers Overall transfer level: Needs assistance Equipment used: Rolling walker (2 wheeled) Transfers: Sit to/from Stand Sit to Stand: Mod assist;+2 physical assistance;+2 safety/equipment;From elevated surface Stand pivot transfers: Min assist;+2 physical assistance;+2 safety/equipment       General transfer comment: Assist to rise, stabilize, control descent. Multimodal cues for posture, safety, adherence to precautions, hand placement. Increased time.   Ambulation/Gait   Ambulation Distance (Feet): 3  Feet Assistive device: Rolling walker (2 wheeled) Gait Pattern/deviations: Step-to pattern;Decreased stance time - right  Min assist; +2 safety/equipement; +2 physical assistance   General Gait Details: Pt took a few steps towards door then stated " I can't go out in hall. Im gonna back up and sit in the chair."    Stairs            Wheelchair Mobility    Modified Rankin (Stroke Patients Only)       Balance           Standing balance support: Bilateral upper extremity supported;During functional activity Standing balance-Leahy Scale: Poor                      Cognition Arousal/Alertness: Awake/alert Behavior During Therapy: Anxious Overall Cognitive Status: Within Functional Limits for tasks assessed                      Exercises      General Comments        Pertinent Vitals/Pain Pain Assessment: 0-10 Pain Score: 9  Pain Location: back, R LE Pain Descriptors / Indicators: Spasm;Sharp;Aching;Sore;Grimacing;Guarding Pain Intervention(s): Limited activity within patient's tolerance;Repositioned;Premedicated before session    Home Living                      Prior Function            PT Goals (current goals can now be found in the care plan section) Progress towards PT goals: Progressing toward goals (very slowly)    Frequency  7X/week    PT Plan Current plan remains appropriate    Co-evaluation PT/OT/SLP Co-Evaluation/Treatment: Yes Reason for Co-Treatment: For patient/therapist safety PT goals  addressed during session: Mobility/safety with mobility       End of Session   Activity Tolerance: Patient limited by pain;Patient limited by fatigue Patient left: in chair;with call bell/phone within reach;with family/visitor present     Time: [id number] PT Time Calculation (min) (ACUTE ONLY): 28 min  Charges:  $Therapeutic Activity: 8-22 mins                    G Codes:      [name], MPT Pager: [phone]

## 2015-05-17 NOTE — Progress Notes (Signed)
Patient set up with [facility] for HH/DME.  No additional needs at this time.     Raymond GurneyL. J. Torell Minder RN, BSN, CM

## 2015-05-18 ENCOUNTER — Ambulatory Visit (HOSPITAL_COMMUNITY): Payer: Medicare Other

## 2015-05-18 DIAGNOSIS — M4806 Spinal stenosis, lumbar region: Secondary | ICD-10-CM | POA: Diagnosis not present

## 2015-05-18 LAB — GLUCOSE, CAPILLARY
GLUCOSE-CAPILLARY: 181 mg/dL — AB (ref 65–99)
Glucose-Capillary: 136 mg/dL — ABNORMAL HIGH (ref 65–99)
Glucose-Capillary: 147 mg/dL — ABNORMAL HIGH (ref 65–99)
Glucose-Capillary: 164 mg/dL — ABNORMAL HIGH (ref 65–99)

## 2015-05-18 NOTE — Clinical Social Work Note (Signed)
Clinical Social Work Assessment  Patient Details  Name: [name] MRN: [id number] Date of Birth: Apr 10, 1934  Date of referral:  05/18/15               Reason for consult:  Facility Placement                Permission sought to share information with:  Facility Art therapist granted to share information::  Yes, Verbal Permission Granted  Name::        Agency::     Relationship::     Contact Information:     Housing/Transportation Living arrangements for the past 2 months:  Single Family Home Source of Information:  Patient Patient Interpreter Needed:    Criminal Activity/Legal Involvement Pertinent to Current Situation/Hospitalization:    Significant Relationships:  Adult Children Lives with:  Self Do you feel safe going back to the place where you live?    Need for family participation in patient care:  No (Coment)  Care giving concerns:  No caregiver   Facilities manager / plan:  CSW met with pt at bedside to help with discharge needs.  Pt was unable to walk without pain today so her doctor ordered an xray to full out and treat.Pt had back surgery.  RN case manager stated  MD concerned with numbness in pt leg and should be ruled out before discharge.  Pt had changed her mind about going home due to pain and wants SNF at discharge. Pt lives alone and will require rehab and possible something to address her hip or leg prior to her discharge.  CSW sent pt information to Miquel Dunn who was working on the authorization.  Pt stated she would go wherever she could get authorized with her insurance.   Employment status:  Psychologist, counselling:  Managed Care PT Recommendations:  [place] / Referral to community resources:     Patient/Family's Response to care:  Pt unhappy and concerned with the pain in her leg since she just had the back surgery she feels she is weaker in the other parts of her body as well.  She does not feel  strong.  Want to go to rehab.  She stated she was scared with the pain she had.   Patient/Family's Understanding of and Emotional Response to Diagnosis, Current Treatment, and Prognosis:  Pt is concerned with her leg since she just had back surgery she is not supposed to do any twisting but with pt in leg she is concerned with being on her own at home.   Emotional Assessment Appearance:  Appears stated age Attitude/Demeanor/Rapport:  Lethargic Affect (typically observed):  Accepting Orientation:  Oriented to Self, Oriented to Place, Oriented to  Time, Oriented to Situation Alcohol / Substance use:    Psych involvement (Current and /or in the community):     Discharge Needs  Concerns to be addressed:    Readmission within the last 30 days:    Current discharge risk:    Barriers to Discharge:      Carlean Jews, LCSW 05/18/2015, 8:26 PM

## 2015-05-18 NOTE — Progress Notes (Signed)
Physical Therapy Treatment Patient Details Name: Martha CabalLela B Balthaser MRN: [id number] DOB: 01/19/1934 Today's Date: 05/18/2015    History of Present Illness s/p micro lumbar decompression L3-4 and L4-5    PT Comments    Patient mobilizing slightly better, continues to require extra time and 2 person assist. Recommnend SNF, will only have 1 family member available, patient's gait remains very unsteady and unsafe.  Follow Up Recommendations  SNF;Supervision/Assistance - 24 hour     Equipment Recommendations  Rolling walker with 5" wheels    Recommendations for Other Services       Precautions / Restrictions Precautions Precautions: Back;Fall Precaution Comments: reviewed back precautions    Mobility  Bed Mobility   Bed Mobility: Supine to Sit;Rolling   Sidelying to sit: Min assist;HOB elevated   Sit to supine: Mod assist;HOB elevated   General bed mobility comments: patient requires extra time, moves in increments, stops frequently, noted jerks about the R leg, HOB maximally elevated to facilitate sitting at the bed edge.  Transfers Overall transfer level: Needs assistance Equipment used: Rolling walker (2 wheeled) Transfers: Sit to/from Stand Sit to Stand: Min assist;Mod assist         [facility], extra time to power up. Patient requests to allow her to do as much as she can. close guarding   Ambulation/Gait Ambulation/Gait assistance: Mod assist;+2 physical assistance;+2 safety/equipment Ambulation Distance (Feet): 10 Feet (x 2) Assistive device: Rolling walker (2 wheeled) Gait Pattern/deviations: Step-to pattern;Antalgic Gait velocity: very slow   General Gait Details: patient was able to progress to ambulate into the bathroom, slow and halting with noted spasm like motions .   Stairs            Wheelchair Mobility    Modified Rankin (Stroke Patients Only)       Balance    Sitting-balance support: Bilateral upper extremity supported;Feet supported       Standing balance support: Bilateral upper extremity supported;During functional activity Standing balance-Leahy Scale: Poor Standing balance comment: requires max UE support to tolerate  pain, requires assist to periwipe after toileting.                    Cognition Arousal/Alertness: Awake/alert Behavior During Therapy: Anxious;WFL for tasks assessed/performed                        Exercises      General Comments        Pertinent Vitals/Pain Pain Score: 8  Pain Descriptors / Indicators: Cramping;Jabbing;Spasm;Squeezing;Stabbing Pain Intervention(s): Limited activity within patient's tolerance;Monitored during session;Patient requesting pain meds-RN notified;Ice applied;Repositioned    Home Living                      Prior Function            PT Goals (current goals can now be found in the care plan section) Progress towards PT goals: Progressing toward goals    Frequency  7X/week    PT Plan Current plan remains appropriate    Co-evaluation             End of Session Equipment Utilized During Treatment: Gait belt Activity Tolerance: Patient limited by pain Patient left: in bed;with call bell/phone within reach     Time: 1334-1406 PT Time Calculation (min) (ACUTE ONLY): 32 min  Charges:  $Gait Training: 8-22 mins $Self Care/Home Management:  8-22                    G Codes:      Rada Hay 05/18/2015, 2:34 PM

## 2015-05-18 NOTE — Progress Notes (Signed)
Occupational Therapy Treatment Patient Details Name: [name] MRN: [id number] DOB: February 22, 1934 Today's Date: 05/18/2015    History of present illness s/p micro lumbar decompression L3-4 and L4-5   OT comments  Focus of session included grooming, adaptive equipment education and reviewing back precautions for ADL safety. Pt provided with back precautions handout and was able to recall 1 out of 3 back precautions using teach back method. Pt appeared anxious during transfers and fearful of falling due to RLE pain limiting pt's mobility. OT still recommending SNF to assist with maximizing independence and safety with ADLs and balance.  Follow Up Recommendations  SNF    Equipment Recommendations  3 in 1 bedside comode    Recommendations for Other Services      Precautions / Restrictions Precautions Precautions: Back;Fall Precaution Booklet Issued: Yes (comment) Precaution Comments: reviewed back precautions Restrictions Weight Bearing Restrictions: No       Mobility Bed Mobility Overal bed mobility: Needs Assistance;+2 for physical assistance Bed Mobility: Rolling;Sit to Sidelying Rolling: Min assist       Sit to sidelying: Mod assist General bed mobility comments: Pt provided with verbal and tactile cues and instructed in log roll technique for getting back into bed. Pt required mod assist for lifting bil LE onto bed. Pt very anxious during sit to sidelying.  Transfers Overall transfer level: Needs assistance Equipment used: Rolling walker (2 wheeled) Transfers: Sit to/from [employer] Sit to Stand: Min assist;+2 physical assistance Stand pivot transfers: Min guard       General transfer comment: Assist to power up from chair. Min guard for safety during stand-pivot transfer to bed and required verbal cues for hand placement to control descent to bed.    Balance Overall balance assessment: Needs assistance Sitting-balance support: No upper extremity  supported;Feet supported Sitting balance-Leahy Scale: Fair     Standing balance support: Bilateral upper extremity supported Standing balance-Leahy Scale: Poor Standing balance comment: requires UE support from rolling walker                   ADL Overall ADL's : Needs assistance/impaired     Grooming: Wash/dry face;Set up;Bed level                                 General ADL Comments: Reviewed back precautions for ADL safety. Reveiwed adaptive equipment option for LB dressing, pt informed therapist that she now has a toilet aid to assist with peri-care.      Vision                     Perception     Praxis      Cognition   Behavior During Therapy: [facility] for tasks assessed/performed;Anxious Overall Cognitive Status: Within Functional Limits for tasks assessed                       Extremity/Trunk Assessment               Exercises     Shoulder Instructions       General Comments      Pertinent Vitals/ Pain       Pain Assessment: 0-10 Pain Score: 10-Worst pain ever Pain Location: back Pain Descriptors / Indicators: Aching;Throbbing Pain Intervention(s): Limited activity within patient's tolerance  Home Living  Prior Functioning/Environment              Frequency Min 2X/week     Progress Toward Goals  OT Goals(current goals can now be found in the care plan section)  Progress towards OT goals: Progressing toward goals  Acute Rehab OT Goals Patient Stated Goal: to get up and move better OT Goal Formulation: With patient Time For Goal Achievement: 05/22/15 Potential to Achieve Goals: Good ADL Goals Pt Will Perform Grooming: with supervision;standing Pt Will Transfer to Toilet: with min guard assist;ambulating;bedside commode Additional ADL Goal #1: pt will follow back precautions with no more than one cue during adls Additional ADL Goal #2: pt will  perform bed mobility with min A following back precautions in preparation for toilet transfers Additional ADL Goal #3: pt will don depends garments with reacher and set up and verbalize toilet aide  Plan Discharge plan remains appropriate    Co-evaluation                 End of Session Equipment Utilized During Treatment: Rolling walker   Activity Tolerance Patient limited by pain   Patient Left in bed;with call bell/phone within reach   Nurse Communication          Time: [id number] OT Time Calculation (min): 18 min  Charges: OT General Charges $OT Visit: 1 Procedure OT Treatments $Self Care/Home Management : 8-22 mins  Smiley HousemanJames Landon Kataleya Zaugg 05/18/2015, 9:28 AM

## 2015-05-18 NOTE — NC FL2 (Signed)
[place] MEDICAID FL2 LEVEL OF CARE SCREENING TOOL     IDENTIFICATION  Patient Name: [name] Birthdate: 04/09/1934 Sex: female Admission Date (Current Location): 05/14/2015  [facility] [place]   Facility and Address:  [facility],  [address], [place] [id number]      Provider Number: [id number]  Attending Physician Name and Address:  Jene EveryJeffrey Beane, MD  Relative Name and Phone Number:       Current Level of Care: Hospital Recommended Level of Care: Skilled Nursing Facility Prior Approval Number:    Date Approved/Denied:   PASRR Number:    Discharge Plan: SNF    Current Diagnoses: Patient Active Problem List   Diagnosis Date Noted  . Spinal stenosis of lumbar region 05/14/2015    Orientation ACTIVITIES/SOCIAL BLADDER RESPIRATION    Self, Time, Situation, Place      Normal  BEHAVIORAL SYMPTOMS/MOOD NEUROLOGICAL BOWEL NUTRITION STATUS        Diet (carb modified)  PHYSICIAN VISITS COMMUNICATION OF NEEDS Height & Weight Skin        201 lbs. Surgical wounds          AMBULATORY STATUS RESPIRATION    Supervision limited Normal      Personal Care Assistance Level of Assistance  Dressing, Bathing Bathing Assistance: Limited assistance   Dressing Assistance: Limited assistance      Functional Limitations Info                SPECIAL CARE FACTORS FREQUENCY                      Additional Factors Info                  Current Medications (05/18/2015): Current Facility-Administered Medications  Medication Dose Route Frequency Provider Last Rate Last Dose  . acetaminophen (TYLENOL) tablet 650 mg  650 mg Oral Q4H PRN Jene EveryJeffrey Beane, MD   650 mg at 05/15/15 2219   Or  . acetaminophen (TYLENOL) suppository 650 mg  650 mg Rectal Q4H PRN Jene EveryJeffrey Beane, MD      . acidophilus (RISAQUAD) capsule 1 capsule  1 capsule Oral Daily Jene EveryJeffrey Beane, MD   1 capsule at 05/18/15 0853  . alum & mag hydroxide-simeth  (MAALOX/MYLANTA) 200-200-20 MG/5ML suspension 30 mL  30 mL Oral Q6H PRN Jene EveryJeffrey Beane, MD      . bisacodyl (DULCOLAX) EC tablet 5 mg  5 mg Oral Daily PRN Jene EveryJeffrey Beane, MD      . diphenhydrAMINE (BENADRYL) capsule 25-50 mg  25-50 mg Oral Q6H PRN Dorothy SparkJaclyn M Bissell, PA-C   25 mg at 05/15/15 1651  . docusate sodium (COLACE) capsule 100 mg  100 mg Oral BID Jene EveryJeffrey Beane, MD   100 mg at 05/18/15 0856  . gabapentin (NEURONTIN) capsule 100 mg  100 mg Oral TID Jene EveryJeffrey Beane, MD   100 mg at 05/18/15 0853  . hydrochlorothiazide (HYDRODIURIL) tablet 25 mg  25 mg Oral Daily Jene EveryJeffrey Beane, MD   25 mg at 05/18/15 0856  . HYDROcodone-acetaminophen (NORCO/VICODIN) 5-325 MG per tablet 1-2 tablet  1-2 tablet Oral Q4H PRN Jene EveryJeffrey Beane, MD   2 tablet at 05/18/15 0654  . HYDROmorphone (DILAUDID) injection 0.5-1 mg  0.5-1 mg Intravenous Q2H PRN Jene EveryJeffrey Beane, MD   1 mg at 05/14/15 1325  . insulin aspart (novoLOG) injection 0-15 Units  0-15 Units Subcutaneous TID WC Jene EveryJeffrey Beane, MD   3 Units at 05/15/15 1200  . magnesium citrate  solution 1 Bottle  1 Bottle Oral Once PRN Jene Every, MD      . menthol-cetylpyridinium (CEPACOL) lozenge 3 mg  1 lozenge Oral PRN Jene Every, MD       Or  . phenol (CHLORASEPTIC) mouth spray 1 spray  1 spray Mouth/Throat PRN Jene Every, MD      . methocarbamol (ROBAXIN) tablet 500 mg  500 mg Oral Q6H PRN Jene Every, MD   500 mg at 05/18/15 0853   Or  . methocarbamol (ROBAXIN) 500 mg in dextrose 5 % 50 mL IVPB  500 mg Intravenous Q6H PRN Jene Every, MD   500 mg at 05/14/15 1004  . ondansetron (ZOFRAN) injection 4 mg  4 mg Intravenous Q4H PRN Jene Every, MD      . oxyCODONE-acetaminophen (PERCOCET/ROXICET) 5-325 MG per tablet 1-2 tablet  1-2 tablet Oral Q4H PRN Jene Every, MD   2 tablet at 05/18/15 [phone]  . pantoprazole (PROTONIX) EC tablet 40 mg  40 mg Oral Daily Jene Every, MD   40 mg at 05/18/15 0853  . senna-docusate (Senokot-S) tablet 1 tablet  1 tablet Oral QHS PRN  Jene Every, MD       Do not use this list as official medication orders. Please verify with discharge summary.  Discharge Medications:   Medication List    STOP taking these medications        CINNAMON PO     GARLIC PO      TAKE these medications        aspirin 325 MG tablet  Take 1 tablet (325 mg total) by mouth daily. Resume 4 days post-op     b complex vitamins tablet  Take 1 tablet by mouth daily.     B-12 1000 MCG Tbcr  Take 1 tablet by mouth daily.     CALCIUM + D PO  Take 1 tablet by mouth daily.     docusate sodium 100 MG capsule  Commonly known as:  COLACE  Take 1 capsule (100 mg total) by mouth 2 (two) times daily as needed for mild constipation.     esomeprazole 20 MG capsule  Commonly known as:  NEXIUM  Take 20 mg by mouth daily at 12 noon.     gabapentin 100 MG capsule  Commonly known as:  NEURONTIN  Take 100 mg by mouth 3 (three) times daily.     hydrochlorothiazide 25 MG tablet  Commonly known as:  HYDRODIURIL  Take 25 mg by mouth daily.     HYDROcodone-acetaminophen 7.5-325 MG tablet  Commonly known as:  NORCO  Take 1-2 tablets by mouth every 4 (four) hours as needed.     lisinopril 10 MG tablet  Commonly known as:  PRINIVIL,ZESTRIL  Take 10 mg by mouth daily.     MAGNESIUM PO  Take 1 tablet by mouth daily.     metFORMIN 1000 MG tablet  Commonly known as:  GLUCOPHAGE  Take 1,000 mg by mouth daily.     multivitamin with minerals Tabs tablet  Take 1 tablet by mouth daily.     simvastatin 20 MG tablet  Commonly known as:  ZOCOR  Take 20 mg by mouth daily.        Relevant Imaging Results:  Relevant Lab Results:  Recent Labs    Additional Information no known allergies  FULL CODE  Annetta Maw, LCSW

## 2015-05-18 NOTE — Progress Notes (Signed)
   Subjective: 4 Days Post-Op Procedure(s) (LRB): MICRO LUMBAR DECOMPRESSION L4-L5 POSSIBLE L3-L4 ( 2 LEVELS) (N/A) Patient reports pain as mild.   Patient seen in rounds with Dr. Lequita HaltAluisio.  Sitting up in chair. Patient is having problems with getting up and walking safely We will resume therapy today.  Plan is to go hoem if improved but may need SNF now after hospital stay.  Objective: Vital signs in last 24 hours: Temp:  [97.5 F (36.4 C)-98.9 F (37.2 C)] 98.4 F (36.9 C) (10/30 0651) Pulse Rate:  [83-88] 88 (10/30 0651) Resp:  [16-20] 20 (10/30 0651) BP: (111-161)/(56-68) 161/68 mmHg (10/30 0651) SpO2:  [97 %-99 %] 99 % (10/30 0651)  Intake/Output from previous day: 10/29 0701 - 10/30 0700 In: 240 [P.O.:240] Out: 1000 [Urine:1000] Intake/Output this shift:     Recent Labs  05/16/15 0955  HGB 10.8*    Recent Labs  05/16/15 0955  WBC 12.7*  RBC 3.64*  HCT 33.5*  PLT 333    Recent Labs  05/16/15 0955  NA 138  K 4.1  CL 101  CO2 29  BUN 16  CREATININE 0.95  GLUCOSE 176*  CALCIUM 9.8   No results for input(s): LABPT, INR in the last 72 hours.  EXAM General - Patient is Alert, Appropriate and Oriented Extremity - Neurovascular intact Sensation intact distally Dorsiflexion/Plantar flexion intact Dressing - dressing C/D/I Motor Function - intact, moving foot and toes well on exam.   Past Medical History  Diagnosis Date  . Hypertension   . Hyperlipidemia   . Shortness of breath dyspnea     with exertion  . Spinal stenosis   . Sciatic pain   . GERD (gastroesophageal reflux disease)   . Anemia   . Diabetes mellitus without complication (HCC)   . Difficulty sleeping     Assessment/Plan: 4 Days Post-Op Procedure(s) (LRB): MICRO LUMBAR DECOMPRESSION L4-L5 POSSIBLE L3-L4 ( 2 LEVELS) (N/A) Principal Problem:   Spinal stenosis of lumbar region  Estimated body mass index is 33.45 kg/(m^2) as calculated from the following:   Height as of this  encounter: 5\' 5"  (1.651 m).   Weight as of this encounter: 91.173 kg (201 lb). Up with therapy Discharge home with home health versus SNF  Complaints of right hip pain when getting and walking. Will check right hip xray today. Dr. Shelle IronBeane to followup tomorrow Will get social worker involved today for possible need of placement  Avel Peacerew Keimora Swartout, PA-C Orthopaedic Surgery 05/18/2015, 7:33 AM

## 2015-05-18 NOTE — Clinical Social Work Note (Signed)
CSW received a call from RN stating that pt was in need of a SNF bed.  Per RN pt was supposed to go home with [facility] yesterday and there was a discharge for pt yesterday.  CSW was not notified until 9am today that this pt did not go home with [facility] yesteday.  Per chart review pt came in on 05/09/15 and it appears that pt was to remain overnight but stayed longer.  Per chart review there is no order for inpatient or obs admission so unclear what status of pt is at this time.   Per chart review case manager met with pt on 05/15/15 and stated that pt was gong home with [facility]  Per chart review orthopedics wrote a d/c order yesterday morning at 7:22am.   Per chart review RN/case manager met with pt at  2:00pm yesterday and set up in home PT services.  There are no further notes to reflect why pt did not go home yesterday and why CSW was not contacted until this morning.   Per the RN this morning no one has met with pt yet to determine if she is going to SNF.  CSW will meet with pt this morning to assess.  Pt has [place] and will require a SNF to obtain auth on a Sunday unclear if this will be possible.    Dede Query, LCSW [place] Worker - Weekend Coverage cell #: [phone]

## 2015-05-18 NOTE — Clinical Social Work Note (Signed)
CSW spoke with [facility] and they stated that they are working on pt insurance auth for d/c tomorrow.  Per case manager and MD pt requires an xray to rule out hip fracture.  Dr. Shelle IronBeane to follow up with pt tomorrow. RN stated she would have discharge order taken out for today. ChiropodistAssistant director consulted.   Weekday CSW will follow up with SNF for discharge tomorrow if MD discharges.

## 2015-05-18 NOTE — Care Management Note (Signed)
Case Management Note  Patient Details  Name: [name] MRN: [id number] Date of Birth: 12/08/1933  Subjective/Objective:                  s/p micro lumbar decompression L3-4 and L4-5  Action/Plan: Discharge planning  Expected Discharge Date:  05/15/15               Expected Discharge Plan:  Skilled Nursing Facility  In-House Referral:     Discharge planning Services  CM Consult  Post Acute Care Choice:    Choice offered to:  Patient  DME Arranged:    DME Agency:     HH Arranged:    HH Agency:     Status of Service:  Completed, signed off  Medicare Important Message Given:    Date Medicare IM Given:    Medicare IM give by:    Date Additional Medicare IM Given:    Additional Medicare Important Message give by:     If discussed at Long Length of Stay Meetings, dates discussed:    Additional Comments: CM spoke with patient at the bedside. States she lives alone and has steps she would need to use when discharged home. She does not feel she will be safe at home. Has numbness in her right leg. Plans to go to SNF at discharge due to safety concerns. She has spoke with her physician today and states the plan is for to be discharged tomorrow. Reports she had an x-ray done today.  Antony HasteBennett, Christyn Gutkowski Harris, RN 05/18/2015, 9:52 AM

## 2015-05-19 DIAGNOSIS — M4806 Spinal stenosis, lumbar region: Secondary | ICD-10-CM | POA: Diagnosis not present

## 2015-05-19 LAB — GLUCOSE, CAPILLARY
GLUCOSE-CAPILLARY: 147 mg/dL — AB (ref 65–99)
GLUCOSE-CAPILLARY: 190 mg/dL — AB (ref 65–99)

## 2015-05-19 NOTE — Discharge Summary (Signed)
Physician Discharge Summary   Patient ID: [name] MRN: [id number] DOB/AGE: 01-22-1934 81 y.o.  Admit date: 05/14/2015 Discharge date: 05/19/2015  Primary Diagnosis:   STENOSIS L4-L5 AND L3-L4   Admission Diagnoses:  Past Medical History  Diagnosis Date  . Hypertension   . Hyperlipidemia   . Shortness of breath dyspnea     with exertion  . Spinal stenosis   . Sciatic pain   . GERD (gastroesophageal reflux disease)   . Anemia   . Diabetes mellitus without complication ([place])   . Difficulty sleeping    Discharge Diagnoses:   Principal Problem:   Spinal stenosis of lumbar region  Procedure:  Procedure(s) (LRB): MICRO LUMBAR DECOMPRESSION L4-L5 POSSIBLE L3-L4 ( 2 LEVELS) (N/A)   Consults: None  HPI:  see H&P    Laboratory Data: Hospital Outpatient Visit on 05/09/2015  Component Date Value Ref Range Status  . Sodium 05/09/2015 138  135 - 145 mmol/L Final  . Potassium 05/09/2015 4.0  3.5 - 5.1 mmol/L Final  . Chloride 05/09/2015 103  101 - 111 mmol/L Final  . CO2 05/09/2015 26  22 - 32 mmol/L Final  . Glucose, Bld 05/09/2015 116* 65 - 99 mg/dL Final  . BUN 05/09/2015 11  6 - 20 mg/dL Final  . Creatinine, Ser 05/09/2015 0.75  0.44 - 1.00 mg/dL Final  . Calcium 05/09/2015 9.8  8.9 - 10.3 mg/dL Final  . GFR calc non Af Amer 05/09/2015 >60  >60 mL/min Final  . GFR calc Af Amer 05/09/2015 >60  >60 mL/min Final   Comment: (NOTE) The eGFR has been calculated using the CKD EPI equation. This calculation has not been validated in all clinical situations. eGFR's persistently <60 mL/min signify possible Chronic Kidney Disease.   . Anion gap 05/09/2015 9  5 - 15 Final  . MRSA, PCR 05/09/2015 NEGATIVE  NEGATIVE Final  . Staphylococcus aureus 05/09/2015 NEGATIVE  NEGATIVE Final   Comment:        The Xpert SA Assay (FDA approved for NASAL specimens in patients over 66 years of age), is one component of a comprehensive surveillance program.  Test performance has been  validated by [facility] for patients greater than or equal to 28 year old. It is not intended to diagnose infection nor to guide or monitor treatment.   . ABO/RH(D) 05/09/2015 O POS   Final  . Antibody Screen 05/09/2015 NEG   Final  . Sample Expiration 05/09/2015 05/17/2015   Final  . ABO/RH(D) 05/09/2015 O POS   Final   No results for input(s): HGB in the last 72 hours. No results for input(s): WBC, RBC, HCT, PLT in the last 72 hours. No results for input(s): NA, K, CL, CO2, BUN, CREATININE, GLUCOSE, CALCIUM in the last 72 hours. No results for input(s): LABPT, INR in the last 72 hours.  X-Rays:Dg Lumbar Spine 2-3 Views  05/09/2015  CLINICAL DATA:  Lumbar spine surgery. EXAM: LUMBAR SPINE - 2-3 VIEW COMPARISON:  CT 12/25/2009. FINDINGS: Diffuse osteopenia. Diffuse multilevel degenerative change. No acute bony abnormality. Normal alignment. Aortoiliac atherosclerotic vascular disease . IMPRESSION: 1. Diffuse degenerative change lumbar spine. No acute abnormality. Normal alignment. 2. Aortoiliac atherosclerotic vascular disease. Electronically Signed   By: Marcello Moores  Register   On: 05/09/2015 12:36   Dg Spine Portable 1 View  05/14/2015  CLINICAL DATA:  Intraoperative during elective surgery. EXAM: PORTABLE SPINE - 1 VIEW COMPARISON:  Intraoperative radiograph from earlier the same day. FINDINGS: Tissue spreaders are seen posterior to L4-L5  vertebral bodies. Metallic probes have been placed. The most superior probe tip terminates at L5-S1, and the more inferior probe terminates at the level of L4 superior endplate. Linear metallic object is again seen overlying the soft tissues posterior to the sacrum. IMPRESSION: Intraoperative radiograph with placement of 2 metallic probes, the more superior probe terminating at L5-S1 disc space, and the more inferior probe terminating at the level of the superior endplate of L4 vertebral body. Electronically Signed   By: Fidela Salisbury M.D.   On:  05/14/2015 09:21   Dg Spine Portable 1 View  05/14/2015  CLINICAL DATA:  Elective surgery EXAM: PORTABLE SPINE - 1 VIEW COMPARISON:  05/09/2015 FINDINGS: Single lateral view of the lumbar spine submitted. Atherosclerotic calcifications of abdominal aorta again noted. There is disc space flattening at L5-S1 level. Mild disc space flattening at L4-L5 level. Multilevel mild anterior spurring. A posterior metallic needle is noted adjacent to inferior margin of L4 spinous process. A second metallic needle is noted at the level upper endplate of L4 vertebral body. IMPRESSION: There is disc space flattening at L5-S1 level. Mild disc space flattening at L4-L5 level. Multilevel mild anterior spurring. A posterior metallic needle is noted adjacent to inferior margin of L4 spinous process. A second metallic needle is noted at the level upper endplate of L4 vertebral body. Electronically Signed   By: Lahoma Crocker M.D.   On: 05/14/2015 08:57   Dg Spine Portable 1 View  05/14/2015  CLINICAL DATA:  Elective surgery EXAM: PORTABLE SPINE - 1 VIEW COMPARISON:  Portable intraoperative exam 0814 hours compared to earlier study of 05/14/2015 and to preoperative images of 05/09/2015 FINDINGS: Preoperative exam labeled with 5 lumbar vertebra. Tissue spreaders present dorsal to the L3-L4 and L4-L5 disc spaces. Metallic probes project dorsal to the L4-L5 disc space and the superior endplate of L5. A linear radiopaque foreign body projects dorsal to the sacrum, uncertain etiology A disc space narrowing L3-L4 and L4-L5. Minimal anterolisthesis L3-L4. Atherosclerotic calcification aorta. IMPRESSION: Metallic probes project dorsal to the L3-L4 disc space an the superior endplate of L5. Additional linear radiopaque foreign body projects dorsal to the mid sacrum, uncertain etiology. Electronically Signed   By: Lavonia Dana M.D.   On: 05/14/2015 08:47   Dg Hip Unilat With Pelvis 2-3 Views Right  05/18/2015  CLINICAL DATA:  Right hip pain  for awhile, worse after back surgery on past Wednesday. No recent injury. EXAM: DG HIP (WITH OR WITHOUT PELVIS) 2-3V RIGHT COMPARISON:  None. FINDINGS: Single view of the pelvis and a cross-table lateral view of the right hip. Osseous alignment is normal. No fracture line or displaced fracture fragment seen. Mild degenerative change noted at each hip joint, right slightly greater than left, with joint space narrowing and minimal osseous spurring. At least mild degenerative change within the lower lumbar spine, incompletely imaged. Sacrum appears well aligned. Soft tissues about the pelvis and right hip are unremarkable. IMPRESSION: No acute findings. Mild degenerative change at each hip joint, right slightly greater than left. No large osteophytes or other secondary signs of advanced degenerative osteoarthritis. At least mild degenerative change within the lower lumbar spine, incompletely imaged at the upper aspects of this exam. Electronically Signed   By: Franki Cabot M.D.   On: 05/18/2015 10:27    EKG: Orders placed or performed during the hospital encounter of 05/09/15  . EKG 12-Lead  . EKG 12-Lead     Hospital Course: Patient was admitted to [facility] and taken  to the OR and underwent the above state procedure without complications.  Patient tolerated the procedure well and was later transferred to the recovery room and then to the orthopaedic floor for postoperative care.  They were given PO and IV analgesics for pain control following their surgery.  They were given 24 hours of postoperative antibiotics.   PT was consulted postop to assist with mobility and transfers.  The patient was allowed to be WBAT with therapy and was taught back precautions. Discharge planning was consulted to help with postop disposition and equipment needs.  Patient had a difficult night on the evening of surgery and started to get up OOB with therapy on day one. Patient was seen in rounds daily and was ready to  go home on day five post-op. Her progress with PT was delayed by pain and weakness in the leg.  They were given discharge instructions and dressing directions.  They were instructed on when to follow up in the office with Dr. Tonita Cong.   Diet: Diabetic diet Activity:WBAT Follow-up:in 10-14 days Disposition - Home Discharged Condition: good   Discharge Instructions    Call MD / Call 911    Complete by:  As directed   If you experience chest pain or shortness of breath, CALL 911 and be transported to the hospital emergency room.  If you develope a fever above 101 F, pus (white drainage) or increased drainage or redness at the wound, or calf pain, call your surgeon's office.     Constipation Prevention    Complete by:  As directed   Drink plenty of fluids.  Prune juice may be helpful.  You may use a stool softener, such as Colace (over the counter) 100 mg twice a day.  Use MiraLax (over the counter) for constipation as needed.     Diet - low sodium heart healthy    Complete by:  As directed      Increase activity slowly as tolerated    Complete by:  As directed             Medication List    STOP taking these medications        CINNAMON PO     GARLIC PO      TAKE these medications        aspirin 325 MG tablet  Take 1 tablet (325 mg total) by mouth daily. Resume 4 days post-op     b complex vitamins tablet  Take 1 tablet by mouth daily.     B-12 1000 MCG Tbcr  Take 1 tablet by mouth daily.     CALCIUM + D PO  Take 1 tablet by mouth daily.     docusate sodium 100 MG capsule  Commonly known as:  COLACE  Take 1 capsule (100 mg total) by mouth 2 (two) times daily as needed for mild constipation.     esomeprazole 20 MG capsule  Commonly known as:  NEXIUM  Take 20 mg by mouth daily at 12 noon.     gabapentin 100 MG capsule  Commonly known as:  NEURONTIN  Take 100 mg by mouth 3 (three) times daily.     hydrochlorothiazide 25 MG tablet  Commonly known as:  HYDRODIURIL    Take 25 mg by mouth daily.     HYDROcodone-acetaminophen 7.5-325 MG tablet  Commonly known as:  NORCO  Take 1-2 tablets by mouth every 4 (four) hours as needed.     lisinopril 10 MG tablet  Commonly known  as:  PRINIVIL,ZESTRIL  Take 10 mg by mouth daily.     MAGNESIUM PO  Take 1 tablet by mouth daily.     metFORMIN 1000 MG tablet  Commonly known as:  GLUCOPHAGE  Take 1,000 mg by mouth daily.     multivitamin with minerals Tabs tablet  Take 1 tablet by mouth daily.     simvastatin 20 MG tablet  Commonly known as:  ZOCOR  Take 20 mg by mouth daily.           Follow-up Information    Follow up with BEANE,JEFFREY C, MD In 2 weeks.   Specialty:  Orthopedic Surgery   Why:  For suture removal   Contact information:   [address] [place] [address] [phone]       Follow up with [facility].   Why:  home health physical therapy and 3n1 (over the commode seat)   Contact information:   [address] [place] [address] [phone]       Follow up with [facility].   Why:  Patient set up with advance home health services for HHPT..contact # [phone]   Contact information:   [address] [place] [address] [phone]       Signed: Lacie Draft, PA-C Orthopaedic Surgery 05/19/2015, 2:11 PM

## 2015-05-19 NOTE — Clinical Social Work Placement (Signed)
   CLINICAL SOCIAL WORK PLACEMENT  NOTE  Date:  05/19/2015  Patient Details  Name: [name] MRN: [id number] Date of Birth: 01/03/1934  Clinical Social Work is seeking post-discharge placement for this patient at the Skilled  Nursing Facility level of care (*CSW will initial, date and re-position this form in  chart as items are completed):  Yes   Patient/family provided with [facility] Clinical Social Work Department's list of facilities offering this level of care within the geographic area requested by the patient (or if unable, by the patient's family).  Yes   Patient/family informed of their freedom to choose among providers that offer the needed level of care, that participate in Medicare, Medicaid or managed care program needed by the patient, have an available bed and are willing to accept the patient.  Yes   Patient/family informed of [facility]'s ownership interest in [facility] and [facility], as well as of the fact that they are under no obligation to receive care at these facilities.  PASRR submitted to EDS on 05/18/15     PASRR number received on 05/18/15     Existing PASRR number confirmed on       FL2 transmitted to all facilities in geographic area requested by pt/family on 05/18/15     FL2 transmitted to all facilities within larger geographic area on       Patient informed that his/her managed care company has contracts with or will negotiate with certain facilities, including the following:        Yes   Patient/family informed of bed offers received.  Patient chooses bed at [facility]     Physician recommends and patient chooses bed at      [facility] 05[facility].  Patient to be transferred to facility by PTAR     Patient family notified on 05/19/15 of transfer.  Name of family member notified:  DAUGHTER     PHYSICIAN       Additional Comment: Pt / daughter agree with d/[facility] to [facility] today. [facility]  transport required. Pt / family are aware out of pocket costs may be associated with [facility] transport. NSG reviewed d/c summary, scripts, avs. Scripts included in d/c packet. Dc Summary sent to SNF for review prior to d/c.   _______________________________________________ Royetta AsalHaidinger, Kevontae Burgoon Lee, LCSW [phone] 05/19/2015, 2:37 PM

## 2015-05-19 NOTE — Progress Notes (Signed)
Pt d/c [facility]. Attempted to call report to supervisor but no one answered. Left a message with callback information.

## 2015-05-19 NOTE — Progress Notes (Signed)
Occupational Therapy Treatment Patient Details Name: Sydnee CabalLela B Bukhari MRN: [id number] DOB: 10/14/1933 Today's Date: 05/19/2015    History of present illness s/p micro lumbar decompression L3-4 and L4-5   OT comments  Pt refused OOB but willing to review and discuss back precautions with ADL activity  Follow Up Recommendations  SNF          Precautions / Restrictions Precautions Precautions: Back;Fall Precaution Comments: reviewed back precautions Restrictions Weight Bearing Restrictions: No       Mobility Bed Mobility Overal bed mobility: Needs Assistance           Sit to sidelying: Mod assist General bed mobility comments: patient requires extra time, moves in increments, stops frequently, noted jerks about the R leg,assist  for legs onto the bed, use of the rail.         ADL Overall ADL's : Needs assistance/impaired                                       General ADL Comments: Pt delclined OOB.  OT Reviewed back precautions for ADL safety with pt and daugther. [facility] answered regarding back precautions and ADL activity. Reveiwed adaptive equipment option for LB dressing, pt informed therapist that she now has a toilet aid to assist with peri-care.                Cognition   Behavior During Therapy: [facility] for tasks assessed/performed;Anxious Overall Cognitive Status: Within Functional Limits for tasks assessed                          Progress Toward Goals  OT Goals(current goals can now be found in the care plan section)  Progress towards OT goals: Progressing toward goals     Plan Discharge plan remains appropriate       End of Session     Activity Tolerance Patient tolerated treatment well   Patient Left in bed;with call bell/phone within reach   Nurse Communication      Functional Assessment Tool Used: clinical judgment Functional Limitation: Self care Self Care Current Status (W2956(G8987): At least 60 percent but less  than 80 percent impaired, limited or restricted Self Care Goal Status (O1308(G8988): At least 40 percent but less than 60 percent impaired, limited or restricted   Time: [id number] OT Time Calculation (min): 10 min  Charges: OT G-codes **NOT FOR INPATIENT CLASS** Functional Assessment Tool Used: clinical judgment Functional Limitation: Self care Self Care Current Status (E9528(G8987): At least 60 percent but less than 80 percent impaired, limited or restricted Self Care Goal Status (U1324(G8988): At least 40 percent but less than 60 percent impaired, limited or restricted OT Treatments $Self Care/Home Management : 8-22 mins  Felita Bump, Karin GoldenLorraine D 05/19/2015, 11:02 AM

## 2015-05-19 NOTE — Progress Notes (Signed)
Physical Therapy Treatment Patient Details Name: Martha CabalLela B Shammas MRN: [id number] DOB: 11/01/1933 Today's Date: 05/19/2015    History of Present Illness s/p micro lumbar decompression L3-4 and L4-5    PT Comments    Patient  Is slowly progressing. Continues to demonstrate " spasm " of R leg  Frequently during mobility.  Patient reports that the R leg feels numb and tingling. Patient will benefit from post acute rehab.  Follow Up Recommendations  SNF;Supervision/Assistance - 24 hour     Equipment Recommendations  Rolling walker with 5" wheels    Recommendations for Other Services       Precautions / Restrictions Precautions Precautions: Back;Fall Restrictions Weight Bearing Restrictions: No    Mobility  Bed Mobility Overal bed mobility: Needs Assistance           Sit to sidelying: Mod assist General bed mobility comments: patient requires extra time, moves in increments, stops frequently, noted jerks about the R leg,assist  for legs onto the bed, use of the rail.  Transfers Overall transfer level: Needs assistance Equipment used: Rolling walker (2 wheeled) Transfers: Sit to/from Stand Sit to Stand: Mod assist         General transfer comment: extra time  , steady assist to rise, R leg noted to spasm and jerk and flex.  [facility] and [facility]  with rail.  Ambulation/Gait Ambulation/Gait assistance: Mod assist Ambulation Distance (Feet): 12 Feet (x2 ) Assistive device: Rolling walker (2 wheeled) Gait Pattern/deviations: Antalgic;Decreased stance time - right;Decreased weight shift to right Gait velocity: very slow   General Gait Details: ambulated to BR annd back to bed, extra time for limited distance. Noted again to have "spasminng" of the R leg.   Stairs            Wheelchair Mobility    Modified Rankin (Stroke Patients Only)       Balance           Standing balance support: During functional activity;Bilateral upper extremity  supported Standing balance-Leahy Scale: Poor Standing balance comment: due to R  knee buckling at time of the  "spasm"                    Cognition Arousal/Alertness: Awake/alert Behavior During Therapy: Anxious                        Exercises      General Comments        Pertinent Vitals/Pain Pain Score: 10-Worst pain ever Pain Location: R thigh, Pain Descriptors / Indicators: Tingling;Spasm;Shooting;Guarding Pain Intervention(s): Limited activity within patient's tolerance;Repositioned;Ice applied;Patient requesting pain meds-RN notified    Home Living                      Prior Function            PT Goals (current goals can now be found in the care plan section) Progress towards PT goals: Progressing toward goals    Frequency  7X/week    PT Plan Current plan remains appropriate    Co-evaluation             End of Session Equipment Utilized During Treatment: Gait belt Activity Tolerance: Patient limited by pain Patient left: in bed;with call bell/phone within reach;with family/visitor present     Time: [id number] PT Time Calculation (min) (ACUTE ONLY): 30 min  Charges:  $Gait Training: 8-22 mins $Self Care/Home Management: 8-22  G Codes:      Rada Hay 05/19/2015, 9:22 AM

## 2015-05-20 ENCOUNTER — Non-Acute Institutional Stay (SKILLED_NURSING_FACILITY): Payer: Medicare Other | Admitting: Adult Health

## 2015-05-20 DIAGNOSIS — G629 Polyneuropathy, unspecified: Secondary | ICD-10-CM

## 2015-05-20 DIAGNOSIS — E785 Hyperlipidemia, unspecified: Secondary | ICD-10-CM

## 2015-05-20 DIAGNOSIS — K219 Gastro-esophageal reflux disease without esophagitis: Secondary | ICD-10-CM | POA: Diagnosis not present

## 2015-05-20 DIAGNOSIS — I10 Essential (primary) hypertension: Secondary | ICD-10-CM

## 2015-05-20 DIAGNOSIS — M4806 Spinal stenosis, lumbar region: Secondary | ICD-10-CM | POA: Diagnosis not present

## 2015-05-20 DIAGNOSIS — M48061 Spinal stenosis, lumbar region without neurogenic claudication: Secondary | ICD-10-CM

## 2015-05-20 DIAGNOSIS — K5900 Constipation, unspecified: Secondary | ICD-10-CM

## 2015-05-20 DIAGNOSIS — E118 Type 2 diabetes mellitus with unspecified complications: Secondary | ICD-10-CM | POA: Diagnosis not present

## 2015-05-22 ENCOUNTER — Encounter (HOSPITAL_COMMUNITY): Payer: Self-pay

## 2015-05-22 ENCOUNTER — Emergency Department (HOSPITAL_COMMUNITY)
Admission: EM | Admit: 2015-05-22 | Discharge: 2015-05-22 | Disposition: A | Discharge status: Home / Self Care | Payer: Medicare Other | Attending: Emergency Medicine | Admitting: Emergency Medicine

## 2015-05-22 ENCOUNTER — Emergency Department (HOSPITAL_COMMUNITY): Payer: Medicare Other

## 2015-05-22 DIAGNOSIS — E785 Hyperlipidemia, unspecified: Secondary | ICD-10-CM | POA: Insufficient documentation

## 2015-05-22 DIAGNOSIS — Z9889 Other specified postprocedural states: Secondary | ICD-10-CM | POA: Insufficient documentation

## 2015-05-22 DIAGNOSIS — Z8669 Personal history of other diseases of the nervous system and sense organs: Secondary | ICD-10-CM | POA: Diagnosis not present

## 2015-05-22 DIAGNOSIS — Z79899 Other long term (current) drug therapy: Secondary | ICD-10-CM | POA: Insufficient documentation

## 2015-05-22 DIAGNOSIS — M5431 Sciatica, right side: Secondary | ICD-10-CM | POA: Diagnosis not present

## 2015-05-22 DIAGNOSIS — E119 Type 2 diabetes mellitus without complications: Secondary | ICD-10-CM | POA: Diagnosis not present

## 2015-05-22 DIAGNOSIS — I10 Essential (primary) hypertension: Secondary | ICD-10-CM | POA: Diagnosis not present

## 2015-05-22 DIAGNOSIS — Z862 Personal history of diseases of the blood and blood-forming organs and certain disorders involving the immune mechanism: Secondary | ICD-10-CM | POA: Insufficient documentation

## 2015-05-22 DIAGNOSIS — R200 Anesthesia of skin: Secondary | ICD-10-CM | POA: Diagnosis present

## 2015-05-22 DIAGNOSIS — K219 Gastro-esophageal reflux disease without esophagitis: Secondary | ICD-10-CM | POA: Insufficient documentation

## 2015-05-22 DIAGNOSIS — Z7982 Long term (current) use of aspirin: Secondary | ICD-10-CM | POA: Insufficient documentation

## 2015-05-22 MED ORDER — NAPROXEN 375 MG PO TABS: 375.0000 mg | ORAL_TABLET | Freq: Two times a day (BID) | ORAL | Status: DC

## 2015-05-22 NOTE — ED Notes (Signed)
Contacted patient's granddaughter Christia ReadingJessica Wright per her request. Voicemail left with granddaughter requesting call back only.

## 2015-05-22 NOTE — ED Notes (Signed)
Bed: RESB Expected date:  Expected time:  Means of arrival:  Comments: EMS/Numbess-Recent surgery

## 2015-05-22 NOTE — ED Provider Notes (Signed)
CSN: [id number]     Arrival date & time 05/22/15  1304 History   First MD Initiated Contact with Patient 05/22/15 1410     Chief Complaint  Patient presents with  . Leg numbness   . Post-op Problem     (Consider location/radiation/quality/duration/timing/severity/associated sxs/prior Treatment) HPI The patient is postoperative for a spinal stenosis decompression last week. This was done by Dr. Jillyn Hidden. She reports that she has not been having significant problems pain. She does reports she's been taking her prescribed pain medications. She does note that postoperatively she was developing some pain that radiated down the right leg. Today she started developing numbness in the leg that is particularly in the lower leg and the foot. She reports they also feel kind of cold to her. She reports that she has been doing some walking with assistance. She states that she has been sitting in a wheelchair and using her legs to propel her forward to get some exercise. She denies any fever, no abdominal pain, no bowel or bladder dysfunction. She states she's been eating well and aside from some of her recovery discomfort that is controlled by her pain medications, she feels well. She denies a significant amount of incisional or localized back pain around the operative site. Past Medical History  Diagnosis Date  . Hypertension   . Hyperlipidemia   . Shortness of breath dyspnea     with exertion  . Spinal stenosis   . Sciatic pain   . GERD (gastroesophageal reflux disease)   . Anemia   . Diabetes mellitus without complication (HCC)   . Difficulty sleeping    Past Surgical History  Procedure Laterality Date  . Abdominal hysterectomy  1980's  . Lumbar laminectomy/decompression microdiscectomy N/A 05/14/2015    Procedure: MICRO LUMBAR DECOMPRESSION L4-L5 POSSIBLE L3-L4 ( 2 LEVELS);  Surgeon: Jene Every, MD;  Location: [facility];  Service: Orthopedics;  Laterality: N/A;   No family history on file. Social  History  Substance Use Topics  . Smoking status: Never Smoker   . Smokeless tobacco: None  . Alcohol Use: No   OB History    No data available     Review of Systems  10 Systems reviewed and are negative for acute change except as noted in the HPI.   Allergies  Review of patient's allergies indicates no known allergies.  Home Medications   Prior to Admission medications   Medication Sig Start Date End Date Taking? Authorizing Provider  aspirin 325 MG tablet Take 1 tablet (325 mg total) by mouth daily. Resume 4 days post-op 05/15/15  Yes Dorothy Spark, PA-C  b complex vitamins tablet Take 1 tablet by mouth daily.   Yes Historical Provider, MD  Calcium Citrate-Vitamin D (CALCIUM + D PO) Take 1 tablet by mouth daily.   Yes Historical Provider, MD  Cyanocobalamin (B-12) 1000 MCG TBCR Take 1 tablet by mouth daily.   Yes Historical Provider, MD  docusate sodium (COLACE) 100 MG capsule Take 1 capsule (100 mg total) by mouth 2 (two) times daily as needed for mild constipation. 05/14/15  Yes Jene Every, MD  esomeprazole (NEXIUM) 20 MG capsule Take 20 mg by mouth daily at 12 noon.   Yes Historical Provider, MD  gabapentin (NEURONTIN) 100 MG capsule Take 100 mg by mouth 3 (three) times daily.  04/14/15  Yes Historical Provider, MD  hydrochlorothiazide (HYDRODIURIL) 25 MG tablet Take 25 mg by mouth daily. 04/16/15  Yes Historical Provider, MD  HYDROcodone-acetaminophen (NORCO) 7.5-325 MG  tablet Take 1-2 tablets by mouth every 4 (four) hours as needed. Patient taking differently: Take 1-2 tablets by mouth every 4 (four) hours as needed for moderate pain or severe pain.  05/14/15  Yes Jene Every, MD  lisinopril (PRINIVIL,ZESTRIL) 10 MG tablet Take 10 mg by mouth daily. 02/24/15  Yes Historical Provider, MD  magnesium hydroxide (MILK OF MAGNESIA) 400 MG/5ML suspension Take 45 mLs by mouth daily as needed for mild constipation.   Yes Historical Provider, MD  magnesium oxide (MAG-OX) 400 MG  tablet Take 400 mg by mouth daily.   Yes Historical Provider, MD  metFORMIN (GLUCOPHAGE) 1000 MG tablet Take 1,000 mg by mouth daily. 04/16/15  Yes Historical Provider, MD  methocarbamol (ROBAXIN) 500 MG tablet Take 500 mg by mouth every 6 (six) hours as needed for muscle spasms.   Yes Historical Provider, MD  Multiple Vitamin (MULTIVITAMIN WITH MINERALS) TABS tablet Take 1 tablet by mouth daily.   Yes Historical Provider, MD  simvastatin (ZOCOR) 20 MG tablet Take 20 mg by mouth daily. 02/06/15  Yes Historical Provider, MD  naproxen (NAPROSYN) 375 MG tablet Take 1 tablet (375 mg total) by mouth 2 (two) times daily. 05/22/15   Arby Barrette, MD   BP 125/43 mmHg  Pulse 85  Temp(Src) 97.9 F (36.6 C) (Oral)  Resp 23  SpO2 93% Physical Exam  Constitutional: She is oriented to person, place, and time. She appears well-developed and well-nourished.  Patient is alert and well in appearance. She is in no acute distress.  HENT:  Head: Normocephalic and atraumatic.  Eyes: EOM are normal. Pupils are equal, round, and reactive to light.  Neck: Neck supple.  Cardiovascular: Normal rate, regular rhythm, normal heart sounds and intact distal pulses.   Pulmonary/Chest: Effort normal and breath sounds normal.  Abdominal: Soft. Bowel sounds are normal. She exhibits no distension. There is no tenderness.  Musculoskeletal: She exhibits no edema.  Patient has a dressing over her central lower spine. This is clean and dry. She does not have tenderness to palpation over the incision site. There is no erythema extending out from beneath the dressing. Bilateral lower extremities are well-developed without any edema or calf tenderness. Dorsalis pedis and posterior tibialis pulses are present by hand-held Doppler on the right. The right foot is well perfused without edema. Patient has intact plantar and dorsiflexion on the right and left. Patient can elevate the left and hold against downward resistance. The patient is  able to elevate the right lower extremity but she cannot resist downward pressure. She reports decreased sensation to light touch on the tibia and the top of the foot on the right.  Neurological: She is alert and oriented to person, place, and time. She has normal strength. Coordination normal. GCS eye subscore is 4. GCS verbal subscore is 5. GCS motor subscore is 6.  Skin: Skin is warm, dry and intact.  Psychiatric: She has a normal mood and affect.    ED Course  Procedures (including critical care time) Labs Review Labs Reviewed - No data to display  Imaging Review Ct Lumbar Spine Wo Contrast  05/22/2015  CLINICAL DATA:  Lumbar laminectomy 05/14/2015. Rule out postop hematoma. EXAM: CT LUMBAR SPINE WITHOUT CONTRAST TECHNIQUE: Multidetector CT imaging of the lumbar spine was performed without intravenous contrast administration. Multiplanar CT image reconstructions were also generated. COMPARISON:  Operative radiographs 05/14/2015. CT abdomen pelvis 12/25/2009 FINDINGS: Atherosclerotic calcification in the aorta and iliac arteries. No aneurysm. 3.5 cm left renal cyst. No renal hydronephrosis.  Negative for lumbar spine fracture. Postop changes of laminectomy at L4-5. Skin staples remain in place. T12-L1: Disc degeneration and spurring. Mild narrowing of the canal. Mild facet degeneration. L1-2: Diffuse bulging of the disc with endplate spurring and mild facet degeneration. L2-3: Diffuse disc bulging. Bilateral facet hypertrophy. Mild to moderate spinal stenosis. L3-4: 3 mm anterior slip. Diffuse disc bulging. Advanced facet hypertrophy. Moderately severe spinal stenosis. L4-5: Bilateral laminectomy. Bilateral facet hypertrophy. Adequate decompression of the canal. Right foraminal and extra foraminal disc protrusion which may be affecting the right L4 nerve root. Correlate with preoperative MRI if available. No evidence of high-density epidural hematoma. MRI is more sensitive than CT for detecting fluid  collections and hematoma. L5-S1: Disc degeneration and diffuse endplate osteophyte formation. Bilateral facet hypertrophy. Mild foraminal narrowing bilaterally. IMPRESSION: Mild to moderate spinal stenosis at L2-3. 3 mm anterior slip L3-4 with moderately severe spinal stenosis. Postop bilateral laminectomy L4-5 with adequate decompression of the canal. Right foraminal and extra foraminal disc protrusion. No evidence of postop hematoma. Note that MRI is more sensitive than CT for detecting hematoma and spinal stenosis. Electronically Signed   By: Marlan Palauharles  Clark M.D.   On: 05/22/2015 15:56   I have personally reviewed and evaluated these images and lab results as part of my medical decision-making.   EKG Interpretation None     Consult (15:25) case reviewed with Dr. Jillyn HiddenBean, the patient's orthopedic surgeon. He advises to proceed with a CT lumbosacral to rule out postoperative hematoma.  Dr. Jillyn HiddenBean has reviewed the patient's CT results. At this time the plan will be for providing naproxen and he will follow-up with the patient for reassessment. The patient be discharged for ongoing outpatient nursing care. MDM   Final diagnoses:  Sciatica of right side  Previous back surgery   The patient is alert and appropriate. At this time no signs of secondary infection. CT does not show evidence of hematoma compression. Patient's pain has been adequately controlled on her prescribed medications. Dr. Jillyn HiddenBean does advise for adding naproxen for several days to help with inflammation. He will evaluate the patient in outpatient follow-up.    Arby BarretteMarcy Jacksen Isip, MD 05/22/15 1739

## 2015-05-22 NOTE — Discharge Instructions (Signed)
Radicular Pain °Radicular pain in either the arm or leg is usually from a bulging or herniated disk in the spine. A piece of the herniated disk may press against the nerves as the nerves exit the spine. This causes pain which is felt at the tips of the nerves down the arm or leg. Other causes of radicular pain may include: °· Fractures. °· Heart disease. °· Cancer. °· An abnormal and usually degenerative state of the nervous system or nerves (neuropathy). °Diagnosis may require CT or MRI scanning to determine the primary cause.  °Nerves that start at the neck (nerve roots) may cause radicular pain in the outer shoulder and arm. It can spread down to the thumb and fingers. The symptoms vary depending on which nerve root has been affected. In most cases radicular pain improves with conservative treatment. Neck problems may require physical therapy, a neck collar, or cervical traction. Treatment may take many weeks, and surgery may be considered if the symptoms do not improve.  °Conservative treatment is also recommended for sciatica. Sciatica causes pain to radiate from the lower back or buttock area down the leg into the foot. Often there is a history of back problems. Most patients with sciatica are better after 2 to 4 weeks of rest and other supportive care. Short term bed rest can reduce the disk pressure considerably. Sitting, however, is not a good position since this increases the pressure on the disk. You should avoid bending, lifting, and all other activities which make the problem worse. Traction can be used in severe cases. Surgery is usually reserved for patients who do not improve within the first months of treatment. °Only take over-the-counter or prescription medicines for pain, discomfort, or fever as directed by your caregiver. Narcotics and muscle relaxants may help by relieving more severe pain and spasm and by providing mild sedation. Cold or massage can give significant relief. Spinal manipulation  is not recommended. It can increase the degree of disc protrusion. Epidural steroid injections are often effective treatment for radicular pain. These injections deliver medicine to the spinal nerve in the space between the protective covering of the spinal cord and back bones (vertebrae). Your caregiver can give you more information about steroid injections. These injections are most effective when given within two weeks of the onset of pain.  °You should see your caregiver for follow up care as recommended. A program for neck and back injury rehabilitation with stretching and strengthening exercises is an important part of management.  °SEEK IMMEDIATE MEDICAL CARE IF: °· You develop increased pain, weakness, or numbness in your arm or leg. °· You develop difficulty with bladder or bowel control. °· You develop abdominal pain. °  °This information is not intended to replace advice given to you by your health care provider. Make sure you discuss any questions you have with your health care provider. °  °Document Released: 08/12/2004 Document Revised: 07/26/2014 Document Reviewed: 01/29/2015 °Elsevier Interactive Patient Education ©2016 [employer] ° °

## 2015-05-22 NOTE — ED Notes (Signed)
Bed: [id number] Expected date:  Expected time:  Means of arrival:  Comments: Hold for resA

## 2015-05-22 NOTE — ED Notes (Signed)
R pedal pulse found w/ doppler and marked w/ skin marker.

## 2015-05-22 NOTE — ED Notes (Signed)
Patient moved into a wheelchair for comfort.

## 2015-05-22 NOTE — ED Notes (Signed)
Per [facility], Pt[facility], c/o intermittent R leg numbness x "a while" and R leg/foot swelling starting last night.  Pt had L3-L5 surgery x 8 days ago.  Pt reports that she was "moving all around the facility yesterday" and noticed that her leg/foot was swollen last night.  No swelling currently noted.  Pt reports decreased sensation in R foot.  Good cap refill noted.

## 2015-05-23 ENCOUNTER — Encounter: Payer: Self-pay | Admitting: Adult Health

## 2015-05-26 ENCOUNTER — Encounter: Payer: Self-pay | Admitting: Internal Medicine

## 2015-05-26 ENCOUNTER — Non-Acute Institutional Stay (SKILLED_NURSING_FACILITY): Payer: Medicare Other | Admitting: Internal Medicine

## 2015-05-26 DIAGNOSIS — I10 Essential (primary) hypertension: Secondary | ICD-10-CM

## 2015-05-26 DIAGNOSIS — M48061 Spinal stenosis, lumbar region without neurogenic claudication: Secondary | ICD-10-CM

## 2015-05-26 DIAGNOSIS — D62 Acute posthemorrhagic anemia: Secondary | ICD-10-CM

## 2015-05-26 DIAGNOSIS — M159 Polyosteoarthritis, unspecified: Secondary | ICD-10-CM

## 2015-05-26 DIAGNOSIS — E1149 Type 2 diabetes mellitus with other diabetic neurological complication: Secondary | ICD-10-CM

## 2015-05-26 DIAGNOSIS — R5381 Other malaise: Secondary | ICD-10-CM

## 2015-05-26 DIAGNOSIS — D72829 Elevated white blood cell count, unspecified: Secondary | ICD-10-CM

## 2015-05-26 DIAGNOSIS — M792 Neuralgia and neuritis, unspecified: Secondary | ICD-10-CM

## 2015-05-26 DIAGNOSIS — K5901 Slow transit constipation: Secondary | ICD-10-CM

## 2015-05-26 DIAGNOSIS — K219 Gastro-esophageal reflux disease without esophagitis: Secondary | ICD-10-CM

## 2015-05-26 DIAGNOSIS — M4806 Spinal stenosis, lumbar region: Secondary | ICD-10-CM

## 2015-05-26 NOTE — Progress Notes (Signed)
Patient ID: [name], female   DOB: 10/28/1933, 81 y.o.   MRN: [id number]     [facility]  PCP: Margaretann Loveless, PA-C  Code Status: Full Code   No Known Allergies  Chief Complaint  Patient presents with  . New Admit To SNF    New Admission      HPI:  81 y.o. patient is here for short term rehabilitation post hospital admission from 05/14/15-05/19/15 with L3-4 and L4-5 stenosis with radiculopathy. shr underwent lumbar decompression. She is seen in her room today. She denies any concerns. Her pain is under control with current regimen. Muscle relaxant have been helpful. Bowel movement has been regulated with stool softner. She has been working with therapy team  Review of Systems:  Constitutional: Negative for fever, chills, diaphoresis.  HENT: Negative for headache, congestion, nasal discharge  Eyes: Negative for eye pain, blurred vision, double vision and discharge.  Respiratory: Negative for cough, shortness of breath and wheezing.   Cardiovascular: Negative for chest pain, palpitations, leg swelling.  Gastrointestinal: Negative for heartburn, nausea, vomiting, abdominal pain Genitourinary: Negative for dysuria, flank pain.  Musculoskeletal: Negative for falls Skin: Negative for itching, rash.  Neurological: Negative for dizziness, tingling, focal weakness Psychiatric/Behavioral: Negative for depression.    Past Medical History  Diagnosis Date  . Hypertension   . Hyperlipidemia   . Shortness of breath dyspnea     with exertion  . Spinal stenosis   . Sciatic pain   . GERD (gastroesophageal reflux disease)   . Anemia   . Diabetes mellitus without complication (HCC)   . Difficulty sleeping    Past Surgical History  Procedure Laterality Date  . Abdominal hysterectomy  1980's  . Lumbar laminectomy/decompression microdiscectomy N/A 05/14/2015    Procedure: MICRO LUMBAR DECOMPRESSION L4-L5 POSSIBLE L3-L4 ( 2 LEVELS);  Surgeon: Jene Every, MD;   Location: [facility];  Service: Orthopedics;  Laterality: N/A;   Social History:   reports that she has never smoked. She does not have any smokeless tobacco history on file. She reports that she does not drink alcohol or use illicit drugs.  History reviewed. No pertinent family history.  Medications:   Medication List       This list is accurate as of: 05/26/15 12:46 PM.  Always use your most recent med list.               aspirin 325 MG tablet  Take 1 tablet (325 mg total) by mouth daily. Resume 4 days post-op     b complex vitamins tablet  Take 1 tablet by mouth daily.     B-12 1000 MCG Tbcr  Take 1 tablet by mouth daily.     CALCIUM + D PO  Take 1 tablet by mouth daily.     docusate sodium 100 MG capsule  Commonly known as:  COLACE  Take 1 capsule (100 mg total) by mouth 2 (two) times daily as needed for mild constipation.     esomeprazole 20 MG capsule  Commonly known as:  NEXIUM  Take 20 mg by mouth daily at 12 noon.     gabapentin 100 MG capsule  Commonly known as:  NEURONTIN  Take 100 mg by mouth 3 (three) times daily.     hydrochlorothiazide 25 MG tablet  Commonly known as:  HYDRODIURIL  Take 25 mg by mouth daily.     HYDROcodone-acetaminophen 7.5-325 MG tablet  Commonly known as:  NORCO  Take 1-2 tablets by  mouth every 4 (four) hours as needed.     lisinopril 10 MG tablet  Commonly known as:  PRINIVIL,ZESTRIL  Take 10 mg by mouth daily.     magnesium oxide 400 MG tablet  Commonly known as:  MAG-OX  Take 400 mg by mouth daily.     metFORMIN 1000 MG tablet  Commonly known as:  GLUCOPHAGE  Take 1,000 mg by mouth daily.     methocarbamol 500 MG tablet  Commonly known as:  ROBAXIN  Take 500 mg by mouth every 6 (six) hours as needed for muscle spasms.     multivitamin with minerals Tabs tablet  Take 1 tablet by mouth daily.     naproxen 375 MG tablet  Commonly known as:  NAPROSYN  Take 1 tablet (375 mg total) by mouth 2 (two) times daily.      polyethylene glycol packet  Commonly known as:  MIRALAX / GLYCOLAX  Take 17 g by mouth 2 (two) times daily.     sennosides-docusate sodium 8.6-50 MG tablet  Commonly known as:  SENOKOT-S  Take 2 tablets by mouth 2 (two) times daily.     simvastatin 20 MG tablet  Commonly known as:  ZOCOR  Take 20 mg by mouth daily.         Physical Exam:  Filed Vitals:   05/26/15 1235  BP: 134/68  Pulse: 93  Temp: 96.6 F (35.9 C)  TempSrc: Oral  Resp: 16  Height: 5\' 5"  (1.651 m)  Weight: 199 lb 12.8 oz (90.629 kg)  SpO2: 96%    General- elderly female, well built, in no acute distress Head- normocephalic, atraumatic Nose- normal nasal mucosa Throat- moist mucus membrane Neck- no cervical lymphadenopathy Cardiovascular- normal s1,s2, no murmurs, palpable dorsalis pedis and radial pulses, trace leg edema Respiratory- bilateral clear to auscultation, no wheeze, no rhonchi, no crackles, no use of accessory muscles Abdomen- bowel sounds present, soft, non tender Musculoskeletal- able to move all 4 extremities, generalized weakness Neurological- no focal deficit, alert and oriented to person, place and time Skin- warm and dry, lumbar region incision with aquacel dressing in place Psychiatry- normal mood and affect    Labs reviewed: Basic Metabolic Panel:  Recent Labs  16/04/9609/21/16 1030 05/15/15 0542 05/16/15 0955  NA 138 139 138  K 4.0 5.0 4.1  CL 103 102 101  CO2 26 28 29   GLUCOSE 116* 159* 176*  BUN 11 14 16   CREATININE 0.75 0.96 0.95  CALCIUM 9.8 9.4 9.8   Liver Function Tests: No results for input(s): AST, ALT, ALKPHOS, BILITOT, PROT, ALBUMIN in the last 8760 hours. No results for input(s): LIPASE, AMYLASE in the last 8760 hours. No results for input(s): AMMONIA in the last 8760 hours. CBC:  Recent Labs  05/16/15 0955  WBC 12.7*  HGB 10.8*  HCT 33.5*  MCV 92.0  PLT 333   CBG:  Recent Labs  05/18/15 2102 05/19/15 0715 05/19/15 1217  GLUCAP 164* 147* 190*      Radiological Exams: Ct Lumbar Spine Wo Contrast  05/22/2015  CLINICAL DATA:  Lumbar laminectomy 05/14/2015. Rule out postop hematoma. EXAM: CT LUMBAR SPINE WITHOUT CONTRAST TECHNIQUE: Multidetector CT imaging of the lumbar spine was performed without intravenous contrast administration. Multiplanar CT image reconstructions were also generated. COMPARISON:  Operative radiographs 05/14/2015. CT abdomen pelvis 12/25/2009 FINDINGS: Atherosclerotic calcification in the aorta and iliac arteries. No aneurysm. 3.5 cm left renal cyst. No renal hydronephrosis. Negative for lumbar spine fracture. Postop changes of laminectomy at L4-5. Skin  staples remain in place. T12-L1: Disc degeneration and spurring. Mild narrowing of the canal. Mild facet degeneration. L1-2: Diffuse bulging of the disc with endplate spurring and mild facet degeneration. L2-3: Diffuse disc bulging. Bilateral facet hypertrophy. Mild to moderate spinal stenosis. L3-4: 3 mm anterior slip. Diffuse disc bulging. Advanced facet hypertrophy. Moderately severe spinal stenosis. L4-5: Bilateral laminectomy. Bilateral facet hypertrophy. Adequate decompression of the canal. Right foraminal and extra foraminal disc protrusion which may be affecting the right L4 nerve root. Correlate with preoperative MRI if available. No evidence of high-density epidural hematoma. MRI is more sensitive than CT for detecting fluid collections and hematoma. L5-S1: Disc degeneration and diffuse endplate osteophyte formation. Bilateral facet hypertrophy. Mild foraminal narrowing bilaterally. IMPRESSION: Mild to moderate spinal stenosis at L2-3. 3 mm anterior slip L3-4 with moderately severe spinal stenosis. Postop bilateral laminectomy L4-5 with adequate decompression of the canal. Right foraminal and extra foraminal disc protrusion. No evidence of postop hematoma. Note that MRI is more sensitive than CT for detecting hematoma and spinal stenosis. Electronically Signed   By: Marlan Palau M.D.   On: 05/22/2015 15:56    Assessment/Plan  Physical deconditioning Will have her work with physical therapy and occupational therapy team to help with gait training and muscle strengthening exercises.fall precautions. Skin care. Encourage to be out of bed.   Lumbar stenosis S/p lumbar decompression. Has f/u with neurosurgery. Continue norco 7.5-325 mg 1-2 tab q4h prn pain. Continue robaxin 500 mg q6h prn muscle spasm. Will have patient work with PT/OT as tolerated to regain strength and restore function.  Fall precautions are in place. Continue ca-vit d.   Leukocytosis Likely reactive, monitor wbc  Blood loss anemia Post op, monitor h&h with patient on aspirin along with naprosyn  HTN Stable bp. Continue hctz 25 mg daily with lisinopril 10 mg daily  DM type 2 Monitor cbg, continue metformin 1000 mg daily  Constipation Continue senna s 2 tab bid with miralax bid. D/c colace for now.   gerd Controlled with daily nexium 20 mg  Neuropathic pain To right leg > left leg, continue neurontin 100 mg tid  OA Continue naproxen 375 mg bid with ca-vit d. On naproxen per orthopedic recs.     Goals of care: short term rehabilitation   Labs/tests ordered: cbc, cmp  Family/ staff Communication: reviewed care plan with patient and nursing supervisor    Oneal Grout, MD  [facility] [phone] (Monday-Friday 8 am - 5 pm) [phone] (afterhours)

## 2015-06-05 ENCOUNTER — Non-Acute Institutional Stay (SKILLED_NURSING_FACILITY): Payer: Medicare Other | Admitting: Adult Health

## 2015-06-05 DIAGNOSIS — D62 Acute posthemorrhagic anemia: Secondary | ICD-10-CM | POA: Diagnosis not present

## 2015-06-05 DIAGNOSIS — E1149 Type 2 diabetes mellitus with other diabetic neurological complication: Secondary | ICD-10-CM | POA: Diagnosis not present

## 2015-06-05 DIAGNOSIS — I10 Essential (primary) hypertension: Secondary | ICD-10-CM

## 2015-06-05 DIAGNOSIS — K219 Gastro-esophageal reflux disease without esophagitis: Secondary | ICD-10-CM

## 2015-06-05 DIAGNOSIS — M159 Polyosteoarthritis, unspecified: Secondary | ICD-10-CM | POA: Diagnosis not present

## 2015-06-05 DIAGNOSIS — M792 Neuralgia and neuritis, unspecified: Secondary | ICD-10-CM | POA: Diagnosis not present

## 2015-06-05 DIAGNOSIS — R5381 Other malaise: Secondary | ICD-10-CM | POA: Diagnosis not present

## 2015-06-05 DIAGNOSIS — M4806 Spinal stenosis, lumbar region: Secondary | ICD-10-CM

## 2015-06-05 DIAGNOSIS — M48061 Spinal stenosis, lumbar region without neurogenic claudication: Secondary | ICD-10-CM

## 2015-06-05 DIAGNOSIS — K5900 Constipation, unspecified: Secondary | ICD-10-CM | POA: Diagnosis not present

## 2015-06-15 NOTE — Progress Notes (Signed)
Patient ID: [name], female   DOB: 1934/01/07, 81 y.o.   MRN: [id number]    DATE:  06/05/15  MRN:  [id number]  BIRTHDAY: 06/16/1934  Facility:  Nursing Home Location:  [facility]  Nursing Home Room Number: 1006-2  LEVEL OF CARE:  SNF [phone])  Contact Information    Name Relation Home Work Mobile   [place] Relative [phone]  [phone]   [place] [phone]        Chief Complaint  Patient presents with  . Discharge Note    Spinal stenosis, lumbar region S/P lumbar decompression L4-L5 and possible L3-L4, constipation, GERD, neuropathy, hypertension, diabetes mellitus, generalized osteoarthritis, anemia and hyperlipidemia    HISTORY OF PRESENT ILLNESS:  This is a 81 year old female who is for discharge home with Home health PT and OT. She was sent to ED due to numbness of right leg. She came back that day with new order for naproxen. No further complaints of numbness after that. She has been admitted to [facility] on 05/19/15 from [facility]. She has PMH of hypertension, hyperlipidemia, spinal stenosis, GERD, anemia and diabetes mellitus without complication. She has a spinal stenosis of lumbar region for which she had lumbar decompression L4-L5 and possible L3-L4 on 05/14/15.   Patient was admitted to this facility for short-term rehabilitation after the patient's recent hospitalization.  Patient has completed SNF rehabilitation and therapy has cleared the patient for discharge.  PAST MEDICAL HISTORY:  Past Medical History  Diagnosis Date  . Hypertension   . Hyperlipidemia   . Shortness of breath dyspnea     with exertion  . Spinal stenosis   . Sciatic pain   . GERD (gastroesophageal reflux disease)   . Anemia   . Diabetes mellitus without complication (HCC)   . Difficulty sleeping     CURRENT MEDICATIONS: Reviewed  Patient's Medications  New Prescriptions   No medications on file  Previous Medications   ASPIRIN  325 MG TABLET    Take 1 tablet (325 mg total) by mouth daily. Resume 4 days post-op   B COMPLEX VITAMINS TABLET    Take 1 tablet by mouth daily.   CALCIUM CITRATE-VITAMIN D (CALCIUM + D PO)    Take 1 tablet by mouth daily.   CYANOCOBALAMIN (B-12) 1000 MCG TBCR    Take 1 tablet by mouth daily.   DOCUSATE SODIUM (COLACE) 100 MG CAPSULE    Take 1 capsule (100 mg total) by mouth 2 (two) times daily as needed for mild constipation.   ESOMEPRAZOLE (NEXIUM) 20 MG CAPSULE    Take 20 mg by mouth daily at 12 noon.   GABAPENTIN (NEURONTIN) 100 MG CAPSULE    Take 100 mg by mouth 3 (three) times daily.    HYDROCHLOROTHIAZIDE (HYDRODIURIL) 25 MG TABLET    Take 25 mg by mouth daily.   HYDROCODONE-ACETAMINOPHEN (NORCO) 7.5-325 MG TABLET    Take 1-2 tablets by mouth every 4 (four) hours as needed.   LISINOPRIL (PRINIVIL,ZESTRIL) 10 MG TABLET    Take 10 mg by mouth daily.   MAGNESIUM OXIDE (MAG-OX) 400 MG TABLET    Take 400 mg by mouth daily.   METFORMIN (GLUCOPHAGE) 1000 MG TABLET    Take 1,000 mg by mouth daily.   METHOCARBAMOL (ROBAXIN) 500 MG TABLET    Take 500 mg by mouth every 6 (six) hours as needed for muscle spasms.   MULTIPLE VITAMIN (MULTIVITAMIN WITH MINERALS) TABS TABLET    Take 1 tablet by  mouth daily.   NAPROXEN 375 MG TABLET     Take 375 mg 1 tablet by mouth twice a day   POLYETHYLENE GLYCOL (MIRALAX / GLYCOLAX) PACKET    Take 17 g by mouth 2 (two) times daily.   SENNOSIDES-DOCUSATE SODIUM (SENOKOT-S) 8.6-50 MG TABLET    Take 2 tablets by mouth 2 (two) times daily.   SIMVASTATIN (ZOCOR) 20 MG TABLET    Take 20 mg by mouth daily.  SENOKOT-S 8.6-50 MG TABLET                                         Take 2 tablets by mouth twice a day   MIRALAX 17 GM                                                                    Take 17 gm by mouth twice a day  ROBAXIN 500 MG                                                                   Take 500 mg 1 tablet by mouth every 6 hours as                                                                                                     needed     No Known Allergies   REVIEW OF SYSTEMS:  GENERAL: no change in appetite, no fatigue, no weight changes, no fever, chills or weakness EYES: Denies change in vision, dry eyes, eye pain, itching or discharge EARS: Denies change in hearing, ringing in ears, or earache NOSE: Denies nasal congestion or epistaxis MOUTH and THROAT: Denies oral discomfort, gingival pain or bleeding, pain from teeth or hoarseness   RESPIRATORY: no cough, SOB, DOE, wheezing, hemoptysis CARDIAC: no chest pain, edema or palpitations GI: no abdominal pain, diarrhea, heart burn, nausea or vomiting GU: Denies dysuria, frequency, hematuria, incontinence, or discharge PSYCHIATRIC: Denies feeling of depression or anxiety. No report of hallucinations, insomnia, paranoia, or agitation   PHYSICAL EXAMINATION  GENERAL APPEARANCE: Well nourished. In no acute distress. Normal body habitus SKIN:  Midline lower back incision with steri-strips, dry, no redness HEAD: Normal in size and contour. No evidence of trauma EYES: Lids open and close normally. No blepharitis, entropion or ectropion. PERRL. Conjunctivae are clear and sclerae are white. Lenses are without opacity EARS: Pinnae are normal. Patient hears normal voice tunes of the examiner MOUTH and THROAT: Lips are without lesions. Oral mucosa is moist and without lesions. Tongue  is normal in shape, size, and color and without lesions NECK: supple, trachea midline, no neck masses, no thyroid tenderness, no thyromegaly LYMPHATICS: no LAN in the neck, no supraclavicular LAN RESPIRATORY: breathing is even & unlabored, BS CTAB CARDIAC: RRR, no murmur,no extra heart sounds, no edema GI: abdomen soft, normal BS, no masses, no tenderness, no hepatomegaly, no splenomegaly EXTREMITIES:  Able to move X 4 extremities PSYCHIATRIC: Alert and oriented X 3. Affect and behavior are  appropriate  LABS/RADIOLOGY: Labs reviewed: 05/27/15  WBC 10.4 hemoglobin 11.6 hematocrit 37.1 MCV 93.0 platelet 470 sodium 140 potassium 4.4 glucose 132 BUN 17 creatinine 0.93 calcium 10.7 Basic Metabolic Panel:  Recent Labs  96/10/5408/21/16 1030 05/15/15 0542 05/16/15 0955  NA 138 139 138  K 4.0 5.0 4.1  CL 103 102 101  CO2 26 28 29   GLUCOSE 116* 159* 176*  BUN 11 14 16   CREATININE 0.75 0.96 0.95  CALCIUM 9.8 9.4 9.8   CBC:  Recent Labs  05/16/15 0955  WBC 12.7*  HGB 10.8*  HCT 33.5*  MCV 92.0  PLT 333   CBG:  Recent Labs  05/18/15 2102 05/19/15 0715 05/19/15 1217  GLUCAP 164* 147* 190*      Ct Lumbar Spine Wo Contrast  05/22/2015  CLINICAL DATA:  Lumbar laminectomy 05/14/2015. Rule out postop hematoma. EXAM: CT LUMBAR SPINE WITHOUT CONTRAST TECHNIQUE: Multidetector CT imaging of the lumbar spine was performed without intravenous contrast administration. Multiplanar CT image reconstructions were also generated. COMPARISON:  Operative radiographs 05/14/2015. CT abdomen pelvis 12/25/2009 FINDINGS: Atherosclerotic calcification in the aorta and iliac arteries. No aneurysm. 3.5 cm left renal cyst. No renal hydronephrosis. Negative for lumbar spine fracture. Postop changes of laminectomy at L4-5. Skin staples remain in place. T12-L1: Disc degeneration and spurring. Mild narrowing of the canal. Mild facet degeneration. L1-2: Diffuse bulging of the disc with endplate spurring and mild facet degeneration. L2-3: Diffuse disc bulging. Bilateral facet hypertrophy. Mild to moderate spinal stenosis. L3-4: 3 mm anterior slip. Diffuse disc bulging. Advanced facet hypertrophy. Moderately severe spinal stenosis. L4-5: Bilateral laminectomy. Bilateral facet hypertrophy. Adequate decompression of the canal. Right foraminal and extra foraminal disc protrusion which may be affecting the right L4 nerve root. Correlate with preoperative MRI if available. No evidence of high-density epidural  hematoma. MRI is more sensitive than CT for detecting fluid collections and hematoma. L5-S1: Disc degeneration and diffuse endplate osteophyte formation. Bilateral facet hypertrophy. Mild foraminal narrowing bilaterally. IMPRESSION: Mild to moderate spinal stenosis at L2-3. 3 mm anterior slip L3-4 with moderately severe spinal stenosis. Postop bilateral laminectomy L4-5 with adequate decompression of the canal. Right foraminal and extra foraminal disc protrusion. No evidence of postop hematoma. Note that MRI is more sensitive than CT for detecting hematoma and spinal stenosis. Electronically Signed   By: Marlan Palauharles  Clark M.D.   On: 05/22/2015 15:56   Dg Hip Unilat With Pelvis 2-3 Views Right  05/18/2015  CLINICAL DATA:  Right hip pain for awhile, worse after back surgery on past Wednesday. No recent injury. EXAM: DG HIP (WITH OR WITHOUT PELVIS) 2-3V RIGHT COMPARISON:  None. FINDINGS: Single view of the pelvis and a cross-table lateral view of the right hip. Osseous alignment is normal. No fracture line or displaced fracture fragment seen. Mild degenerative change noted at each hip joint, right slightly greater than left, with joint space narrowing and minimal osseous spurring. At least mild degenerative change within the lower lumbar spine, incompletely imaged. Sacrum appears well aligned. Soft tissues about the pelvis and right  hip are unremarkable. IMPRESSION: No acute findings. Mild degenerative change at each hip joint, right slightly greater than left. No large osteophytes or other secondary signs of advanced degenerative osteoarthritis. At least mild degenerative change within the lower lumbar spine, incompletely imaged at the upper aspects of this exam. Electronically Signed   By: Bary Richard M.D.   On: 05/18/2015 10:27    ASSESSMENT/PLAN:  Spinal stenosis of lumbar region S/P lumbar decompression L4-L5 and possible L3-L4 - for Home health PT and OT; continue aspirin 325 mg 1 tab by mouth daily;  WBAT; Norco 7.5/325 mg 1-2 tabs by mouth every 4 hours when necessary for pain; follow-up with Dr. Shelle Iron, orthopedic surgeon; continue Robaxin 500 mg 1 tab by mouth every 6 hours when necessary for muscle spasm  Constipation - continue Colace 100 mg 1 capsule by mouth twice a day and magnesium 400 mg 1 tab by mouth daily  GERD - continue Nexium 20 mg 1 capsule by mouth every 12 noon  Neuropathy - continue gabapentin 100 mg 1 capsule by mouth 3 times a day  Hypertension - continue HCTZ 25 mg 1 tab by mouth daily and lisinopril 10 mg 1 tab by mouth daily  Diabetes mellitus, type II - continue metformin 1000 mg 1 tab by mouth daily  Hyperlipidemia - continue Zocor 20 mg 1 tab by mouth daily  Generalized osteoarthritis - continue naproxen 375 mg 1 tablet by mouth twice a day  Anemia, acute blood loss - recheck hemoglobin 11.6, improved from 10.8      I have filled out patient's discharge paperwork and written prescriptions.  Patient will receive home health PT and OT.  Total discharge time: Greater than 30 minutes  Discharge time involved coordination of the discharge process with social worker, nursing staff and therapy department. Medical justification for home health services verified.     [facility], NP [employer] [phone]

## 2015-06-15 NOTE — Progress Notes (Signed)
Patient ID: [name], female   DOB: 16-Nov-1933, 81 y.o.   MRN: [id number]    DATE:  05/20/15  MRN:  [id number]  BIRTHDAY: 1934/05/10  Facility:  Nursing Home Location:  [facility]  Nursing Home Room Number: 1006-2  LEVEL OF CARE:  [id number])  Contact Information    Name Relation Home Work Mobile   [place] Relative [phone]  [phone]   [place] [phone]        Chief Complaint  Patient presents with  . Hospitalization Follow-up    Spinal stenosis, lumbar region S/P lumbar decompression L4-L5 and possible L3-L4, constipation, GERD, neuropathy, hypertension, diabetes mellitus and hyperlipidemia    HISTORY OF PRESENT ILLNESS:  This is a 81 year old female who has been admitted to [facility] on 05/19/15 from [facility]. She has PMH of hypertension, hyperlipidemia, spinal stenosis, GERD, anemia and diabetes mellitus without complication. She has a spinal stenosis of lumbar region for which she had lumbar decompression L4-L5 and possible L3-L4 on 05/14/15. She has been admitted for a short-term rehabilitation.  PAST MEDICAL HISTORY:  Past Medical History  Diagnosis Date  . Hypertension   . Hyperlipidemia   . Shortness of breath dyspnea     with exertion  . Spinal stenosis   . Sciatic pain   . GERD (gastroesophageal reflux disease)   . Anemia   . Diabetes mellitus without complication (HCC)   . Difficulty sleeping      CURRENT MEDICATIONS: Reviewed  Patient's Medications  New Prescriptions   No medications on file  Previous Medications   ASPIRIN 325 MG TABLET    Take 1 tablet (325 mg total) by mouth daily. Resume 4 days post-op   B COMPLEX VITAMINS TABLET    Take 1 tablet by mouth daily.   CALCIUM CITRATE-VITAMIN D (CALCIUM + D PO)    Take 1 tablet by mouth daily.   CYANOCOBALAMIN (B-12) 1000 MCG TBCR    Take 1 tablet by mouth daily.   DOCUSATE SODIUM (COLACE) 100 MG CAPSULE    Take 1 capsule (100 mg total) by  mouth 2 (two) times daily as needed for mild constipation.   ESOMEPRAZOLE (NEXIUM) 20 MG CAPSULE    Take 20 mg by mouth daily at 12 noon.   GABAPENTIN (NEURONTIN) 100 MG CAPSULE    Take 100 mg by mouth 3 (three) times daily.    HYDROCHLOROTHIAZIDE (HYDRODIURIL) 25 MG TABLET    Take 25 mg by mouth daily.   HYDROCODONE-ACETAMINOPHEN (NORCO) 7.5-325 MG TABLET    Take 1-2 tablets by mouth every 4 (four) hours as needed.   LISINOPRIL (PRINIVIL,ZESTRIL) 10 MG TABLET    Take 10 mg by mouth daily.   MAGNESIUM OXIDE (MAG-OX) 400 MG TABLET    Take 400 mg by mouth daily.   METFORMIN (GLUCOPHAGE) 1000 MG TABLET    Take 1,000 mg by mouth daily.   METHOCARBAMOL (ROBAXIN) 500 MG TABLET    Take 500 mg by mouth every 6 (six) hours as needed for muscle spasms.   MULTIPLE VITAMIN (MULTIVITAMIN WITH MINERALS) TABS TABLET    Take 1 tablet by mouth daily.       POLYETHYLENE GLYCOL (MIRALAX / GLYCOLAX) PACKET    Take 17 g by mouth 2 (two) times daily.   SENNOSIDES-DOCUSATE SODIUM (SENOKOT-S) 8.6-50 MG TABLET    Take 2 tablets by mouth 2 (two) times daily.   SIMVASTATIN (ZOCOR) 20 MG TABLET    Take 20 mg by mouth daily.  Modified Medications   No medications on file  Discontinued Medications   No medications on file    No Known Allergies   REVIEW OF SYSTEMS:  GENERAL: no change in appetite, no fatigue, no weight changes, no fever, chills or weakness EYES: Denies change in vision, dry eyes, eye pain, itching or discharge EARS: Denies change in hearing, ringing in ears, or earache NOSE: Denies nasal congestion or epistaxis MOUTH and THROAT: Denies oral discomfort, gingival pain or bleeding, pain from teeth or hoarseness   RESPIRATORY: no cough, SOB, DOE, wheezing, hemoptysis CARDIAC: no chest pain, edema or palpitations GI: no abdominal pain, diarrhea, heart burn, nausea or vomiting GU: Denies dysuria, frequency, hematuria, incontinence, or discharge PSYCHIATRIC: Denies feeling of depression or anxiety. No  report of hallucinations, insomnia, paranoia, or agitation   PHYSICAL EXAMINATION  GENERAL APPEARANCE: Well nourished. In no acute distress. Normal body habitus SKIN:  Midline lower back incision with aquacel dressing, no erythema, dry HEAD: Normal in size and contour. No evidence of trauma EYES: Lids open and close normally. No blepharitis, entropion or ectropion. PERRL. Conjunctivae are clear and sclerae are white. Lenses are without opacity EARS: Pinnae are normal. Patient hears normal voice tunes of the examiner MOUTH and THROAT: Lips are without lesions. Oral mucosa is moist and without lesions. Tongue is normal in shape, size, and color and without lesions NECK: supple, trachea midline, no neck masses, no thyroid tenderness, no thyromegaly LYMPHATICS: no LAN in the neck, no supraclavicular LAN RESPIRATORY: breathing is even & unlabored, BS CTAB CARDIAC: RRR, no murmur,no extra heart sounds, no edema GI: abdomen soft, normal BS, no masses, no tenderness, no hepatomegaly, no splenomegaly EXTREMITIES:  Able to move X 4 extremities PSYCHIATRIC: Alert and oriented X 3. Affect and behavior are appropriate  LABS/RADIOLOGY: Labs reviewed: Basic Metabolic Panel:  Recent Labs  40/98/11 1030 05/15/15 0542 05/16/15 0955  NA 138 139 138  K 4.0 5.0 4.1  CL 103 102 101  CO2 GLUCOSE 116* 159* 176*  BUN CREATININE 0.75 0.96 0.95  CALCIUM 9.8 9.4 9.8   CBC:  Recent Labs  05/16/15 0955  WBC 12.7*  HGB 10.8*  HCT 33.5*  MCV 92.0  PLT 333   CBG:  Recent Labs  05/18/15 2102 05/19/15 0715 05/19/15 1217  GLUCAP 164* 147* 190*      Ct Lumbar Spine Wo Contrast  05/22/2015  CLINICAL DATA:  Lumbar laminectomy 05/14/2015. Rule out postop hematoma. EXAM: CT LUMBAR SPINE WITHOUT CONTRAST TECHNIQUE: Multidetector CT imaging of the lumbar spine was performed without intravenous contrast administration. Multiplanar CT image reconstructions were also generated.  COMPARISON:  Operative radiographs 05/14/2015. CT abdomen pelvis 12/25/2009 FINDINGS: Atherosclerotic calcification in the aorta and iliac arteries. No aneurysm. 3.5 cm left renal cyst. No renal hydronephrosis. Negative for lumbar spine fracture. Postop changes of laminectomy at L4-5. Skin staples remain in place. T12-L1: Disc degeneration and spurring. Mild narrowing of the canal. Mild facet degeneration. L1-2: Diffuse bulging of the disc with endplate spurring and mild facet degeneration. L2-3: Diffuse disc bulging. Bilateral facet hypertrophy. Mild to moderate spinal stenosis. L3-4: 3 mm anterior slip. Diffuse disc bulging. Advanced facet hypertrophy. Moderately severe spinal stenosis. L4-5: Bilateral laminectomy. Bilateral facet hypertrophy. Adequate decompression of the canal. Right foraminal and extra foraminal disc protrusion which may be affecting the right L4 nerve root. Correlate with preoperative MRI if available. No evidence of high-density epidural hematoma. MRI is more sensitive than CT for detecting fluid  collections and hematoma. L5-S1: Disc degeneration and diffuse endplate osteophyte formation. Bilateral facet hypertrophy. Mild foraminal narrowing bilaterally. IMPRESSION: Mild to moderate spinal stenosis at L2-3. 3 mm anterior slip L3-4 with moderately severe spinal stenosis. Postop bilateral laminectomy L4-5 with adequate decompression of the canal. Right foraminal and extra foraminal disc protrusion. No evidence of postop hematoma. Note that MRI is more sensitive than CT for detecting hematoma and spinal stenosis. Electronically Signed   By: Marlan Palauharles  Clark M.D.   On: 05/22/2015 15:56   Dg Hip Unilat With Pelvis 2-3 Views Right  05/18/2015  CLINICAL DATA:  Right hip pain for awhile, worse after back surgery on past Wednesday. No recent injury. EXAM: DG HIP (WITH OR WITHOUT PELVIS) 2-3V RIGHT COMPARISON:  None. FINDINGS: Single view of the pelvis and a cross-table lateral view of the right hip.  Osseous alignment is normal. No fracture line or displaced fracture fragment seen. Mild degenerative change noted at each hip joint, right slightly greater than left, with joint space narrowing and minimal osseous spurring. At least mild degenerative change within the lower lumbar spine, incompletely imaged. Sacrum appears well aligned. Soft tissues about the pelvis and right hip are unremarkable. IMPRESSION: No acute findings. Mild degenerative change at each hip joint, right slightly greater than left. No large osteophytes or other secondary signs of advanced degenerative osteoarthritis. At least mild degenerative change within the lower lumbar spine, incompletely imaged at the upper aspects of this exam. Electronically Signed   By: Bary RichardStan  Maynard M.D.   On: 05/18/2015 10:27    ASSESSMENT/PLAN:  Spinal stenosis of lumbar region S/P lumbar decompression L4-L5 and possible L3-L4 - for rehabilitation; continue aspirin 325 mg 1 tab by mouth daily; WBAT; Norco 7.5/325 mg 1-2 tabs by mouth every 4 hours when necessary for pain; follow-up with Dr. Shelle IronBeane, orthopedic surgeon, in 2 weeks; start Robaxin 500 mg 1 tab by mouth every 6 hours when necessary for muscle spasm  Constipation - continue Colace 100 mg 1 capsule by mouth twice a day and magnesium 400 mg 1 tab by mouth daily  GERD - continue Nexium 20 mg 1 capsule by mouth every 12 noon  Neuropathy - continue gabapentin 100 mg 1 capsule by mouth 3 times a day  Hypertension - continue HCTZ 25 mg 1 tab by mouth daily and lisinopril 10 mg 1 tab by mouth daily  Diabetes mellitus, type II - continue metformin 1000 mg 1 tab by mouth daily  Hyperlipidemia - continue Zocor 20 mg 1 tab by mouth daily     Goals of care:  Short-term rehabilitation    [facility] HospitalMEDINA-VARGAS,MONINA, NP [facility] [phone]

## 2015-09-19 ENCOUNTER — Other Ambulatory Visit: Payer: Self-pay | Admitting: Adult Health

## 2016-01-09 ENCOUNTER — Other Ambulatory Visit: Payer: Self-pay | Admitting: Adult Health

## 2016-09-07 ENCOUNTER — Other Ambulatory Visit: Payer: Self-pay | Admitting: Physician Assistant

## 2016-09-07 DIAGNOSIS — R1011 Right upper quadrant pain: Secondary | ICD-10-CM

## 2016-09-07 DIAGNOSIS — R1084 Generalized abdominal pain: Secondary | ICD-10-CM

## 2016-09-13 ENCOUNTER — Ambulatory Visit
Admission: RE | Admit: 2016-09-13 | Discharge: 2016-09-13 | Disposition: A | Discharge status: Home / Self Care | Payer: Medicare Other | Source: Ambulatory Visit | Attending: Physician Assistant | Admitting: Physician Assistant

## 2016-09-13 DIAGNOSIS — R1084 Generalized abdominal pain: Secondary | ICD-10-CM

## 2016-09-13 DIAGNOSIS — R1011 Right upper quadrant pain: Secondary | ICD-10-CM

## 2016-09-14 ENCOUNTER — Other Ambulatory Visit: Payer: Self-pay | Admitting: Physician Assistant

## 2016-09-14 DIAGNOSIS — N281 Cyst of kidney, acquired: Secondary | ICD-10-CM

## 2016-09-16 ENCOUNTER — Ambulatory Visit
Admission: RE | Admit: 2016-09-16 | Discharge: 2016-09-16 | Disposition: A | Discharge status: Home / Self Care | Payer: Medicare Other | Source: Ambulatory Visit | Attending: Physician Assistant | Admitting: Physician Assistant

## 2016-09-16 DIAGNOSIS — N281 Cyst of kidney, acquired: Secondary | ICD-10-CM

## 2016-09-16 MED ORDER — IOPAMIDOL (ISOVUE-300) INJECTION 61%
125.0000 mL | Freq: Once | INTRAVENOUS | Status: AC | PRN
Start: 2016-09-16 — End: 2016-09-16
  Administered 2016-09-16: 125 mL via INTRAVENOUS

## 2016-09-21 IMAGING — DX DG HIP (WITH OR WITHOUT PELVIS) 2-3V*R*
2 series · 2 of 2 positions shown · non-contrast
Comparison: None.

CLINICAL DATA: Right hip pain for awhile, worse after back surgery
on past [REDACTED]. No recent injury.

EXAM:
DG HIP (WITH OR WITHOUT PELVIS) 2-3V RIGHT

[hip lat]
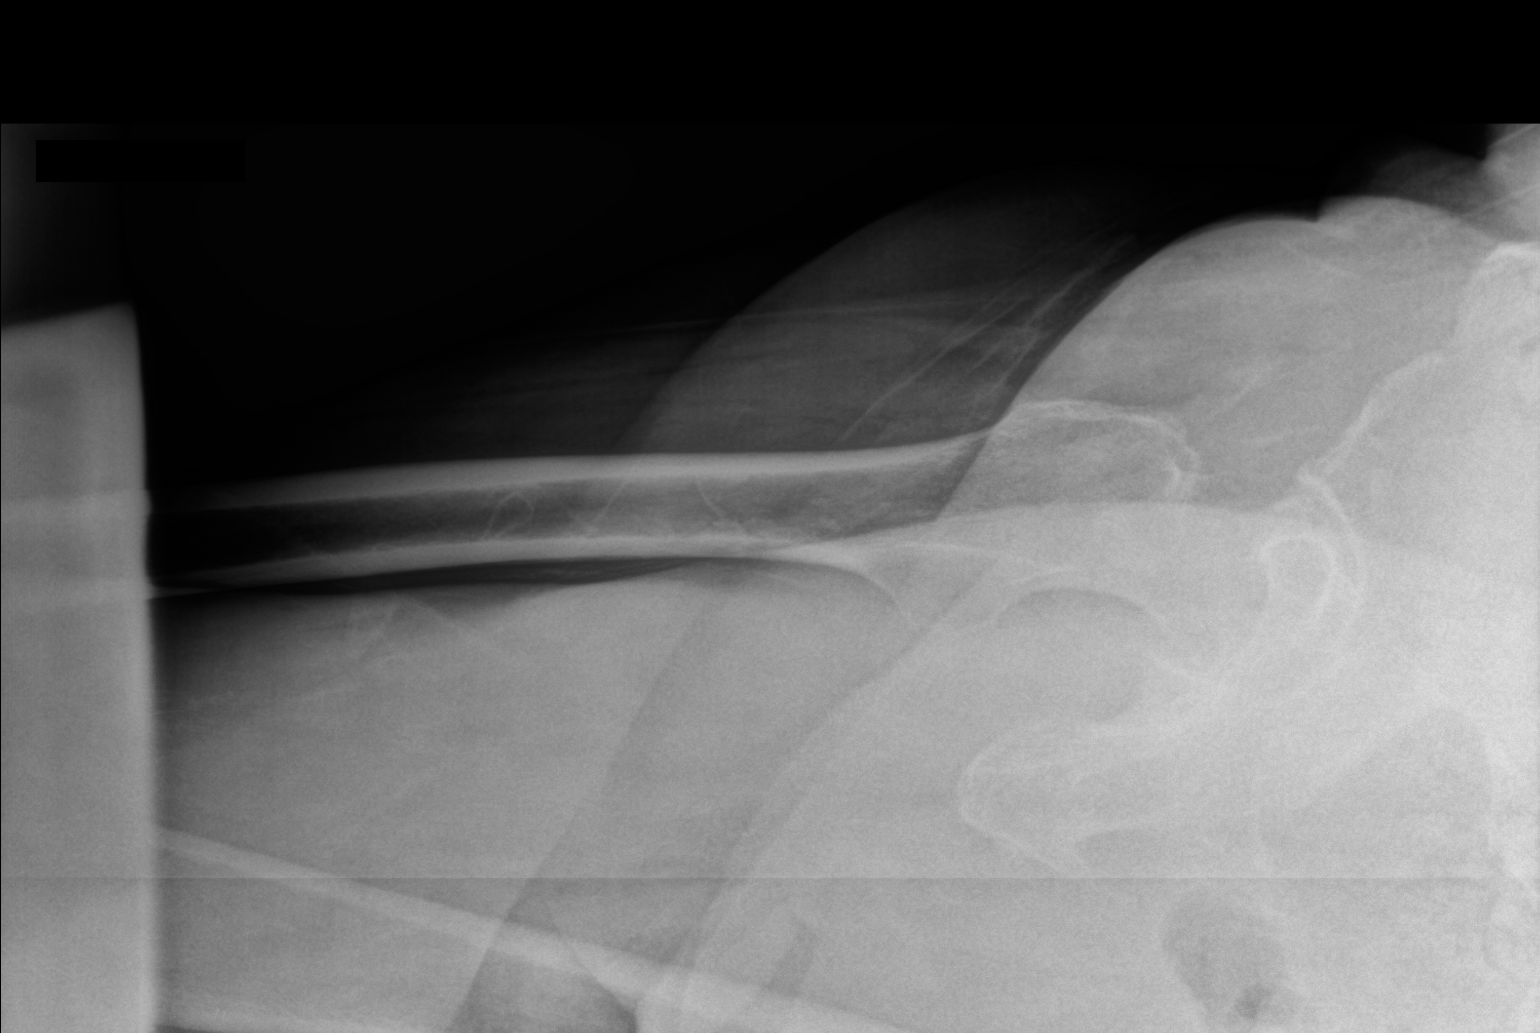

[pelvis ap]
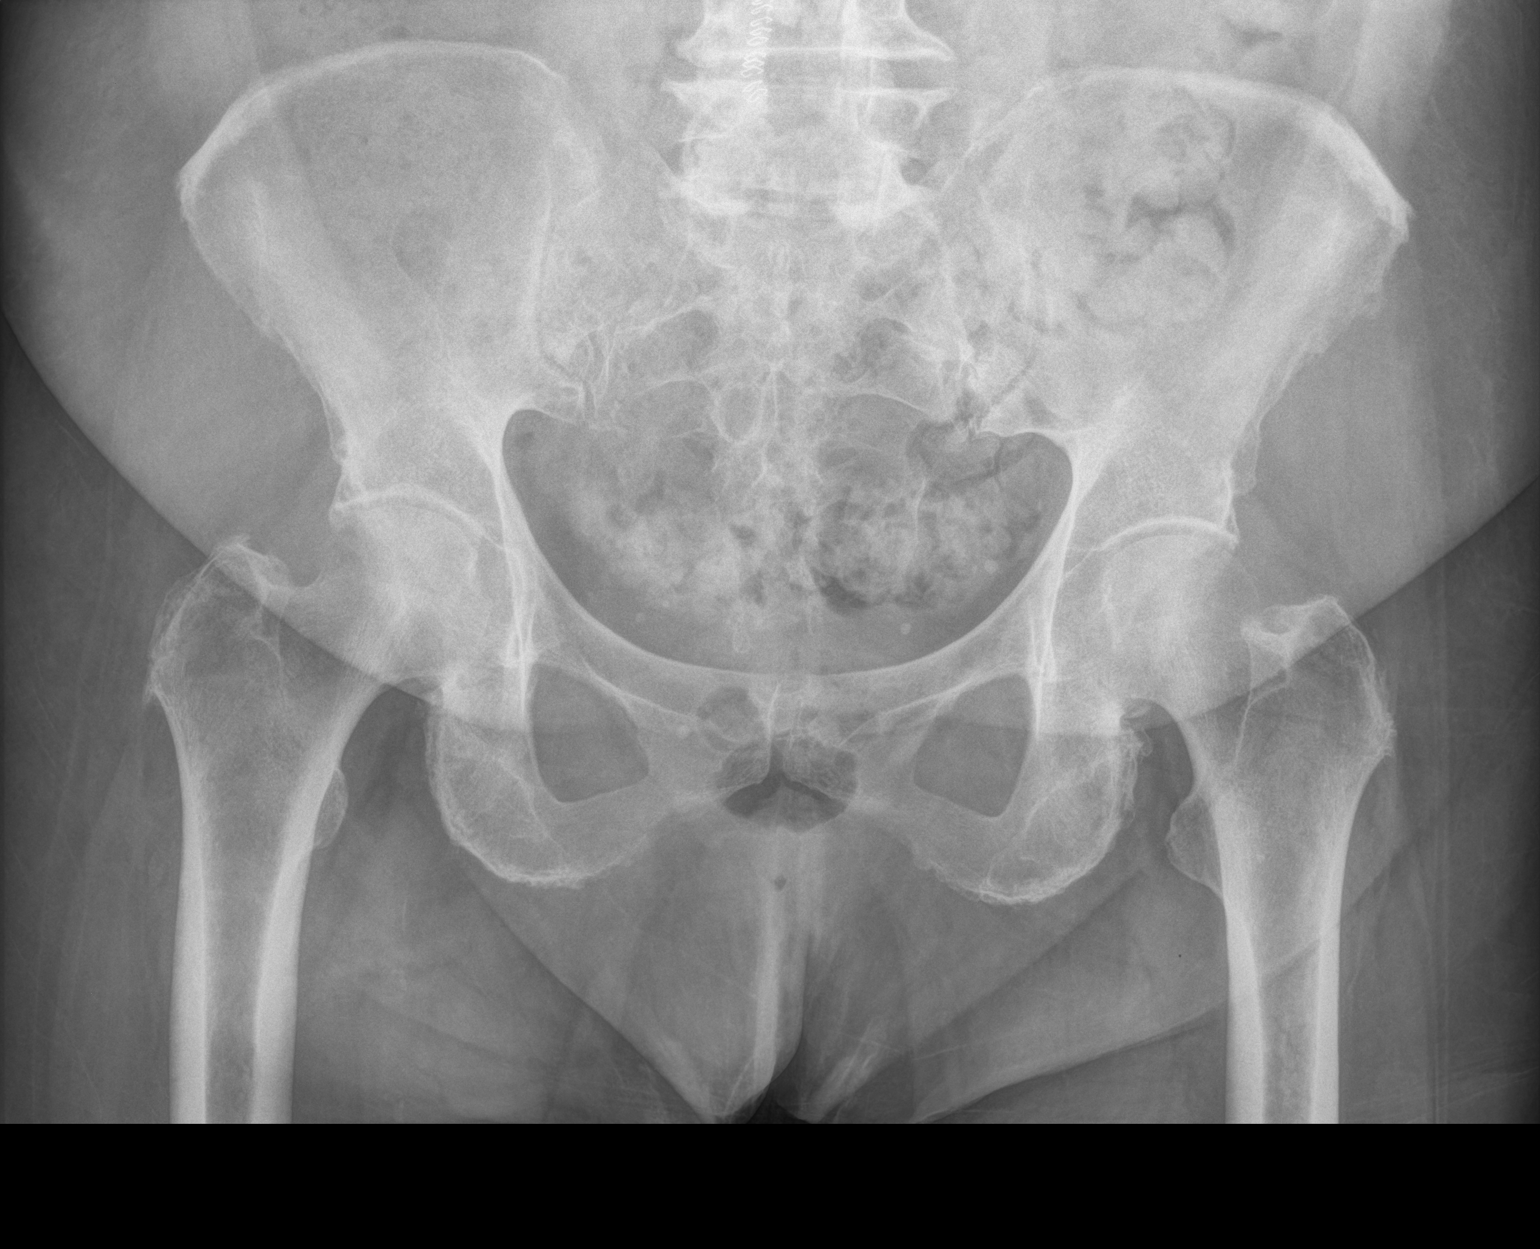

[2 of 2 positions shown; findings below may reference images not displayed]

FINDINGS: Single view of the pelvis and a cross-table lateral view of the
right hip. Osseous alignment is normal. No fracture line or
displaced fracture fragment seen.

Mild degenerative change noted at each hip joint, right slightly
greater than left, with joint space narrowing and minimal osseous
spurring.

At least mild degenerative change within the lower lumbar spine,
incompletely imaged. Sacrum appears well aligned. Soft tissues about
the pelvis and right hip are unremarkable.
IMPRESSION: No acute findings.

Mild degenerative change at each hip joint, right slightly greater
than left. No large osteophytes or other secondary signs of advanced
degenerative osteoarthritis.

At least mild degenerative change within the lower lumbar spine,
incompletely imaged at the upper aspects of this exam.

## 2016-09-25 IMAGING — CT CT L SPINE W/O CM
3 of 4 series · 12 of 33 positions shown, 14 images · non-contrast
Comparison: Operative radiographs 05/14/2015. CT abdomen pelvis
12/25/2009

CLINICAL DATA: Lumbar laminectomy 05/14/2015. Rule out postop
hematoma.

EXAM:
CT LUMBAR SPINE WITHOUT CONTRAST
TECHNIQUE: Multidetector CT imaging of the lumbar spine was performed without
intravenous contrast administration. Multiplanar CT image
reconstructions were also generated.

[Series 6: coronal · coronal · 0.36mm/px · 3 of 68 slices shown]
[im 14/68  bone]
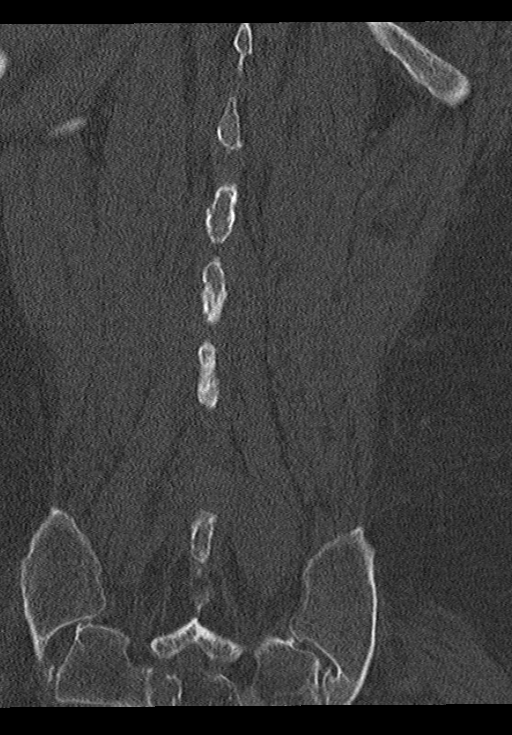
[im 27/68  bone]
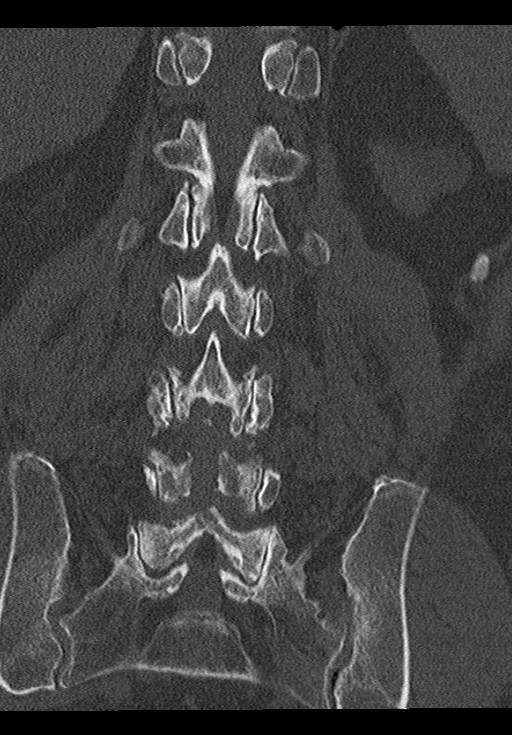
[im 41/68  bone]
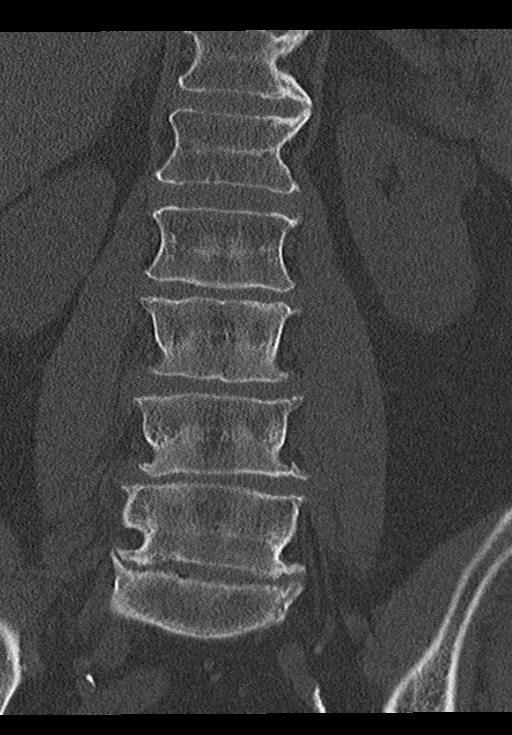

[Series 8: axial reformats · axial · 0.25mm/px · z∈[-193,-15]mm · 4 of 137 slices shown, 5 images]
[im 23/137  soft-tissue]
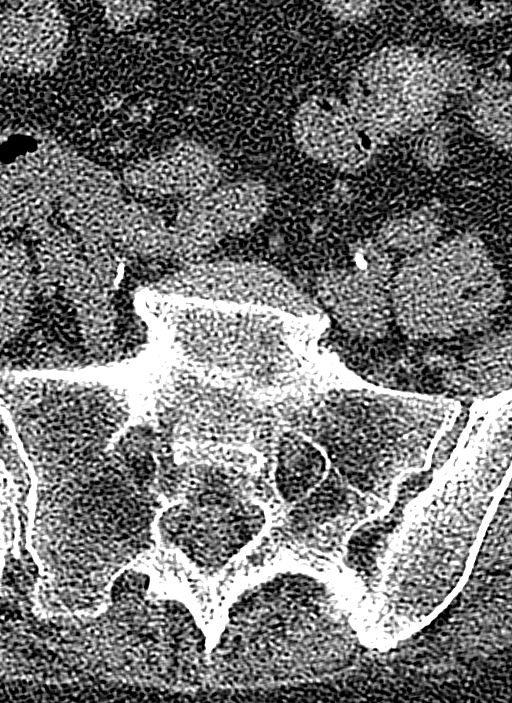
[im 23/137  bone]
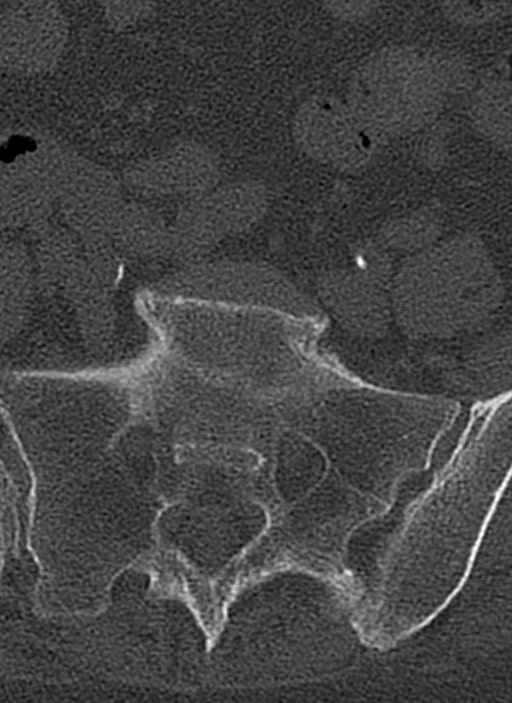
[im 46/137  bone]
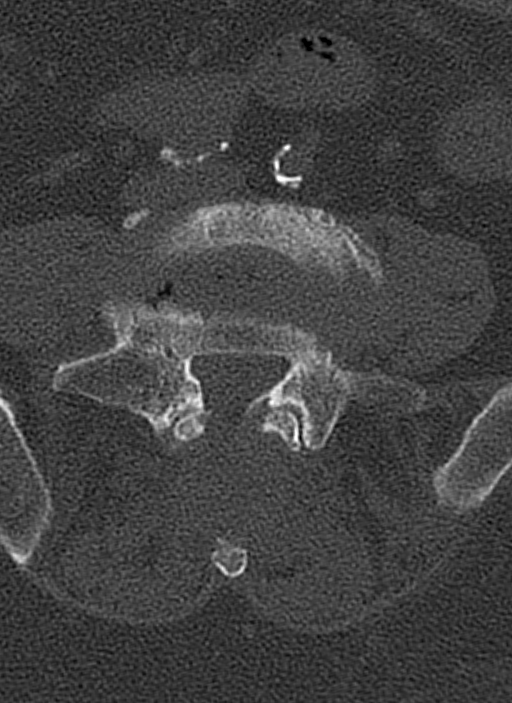
[im 91/137  bone]
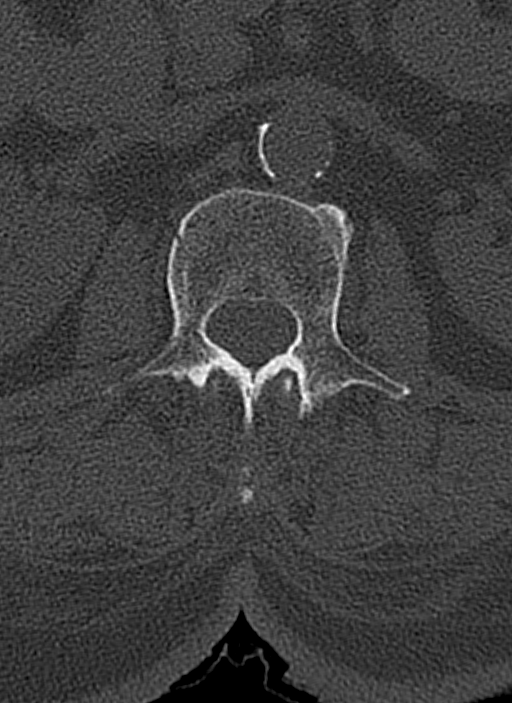
[im 114/137  bone]
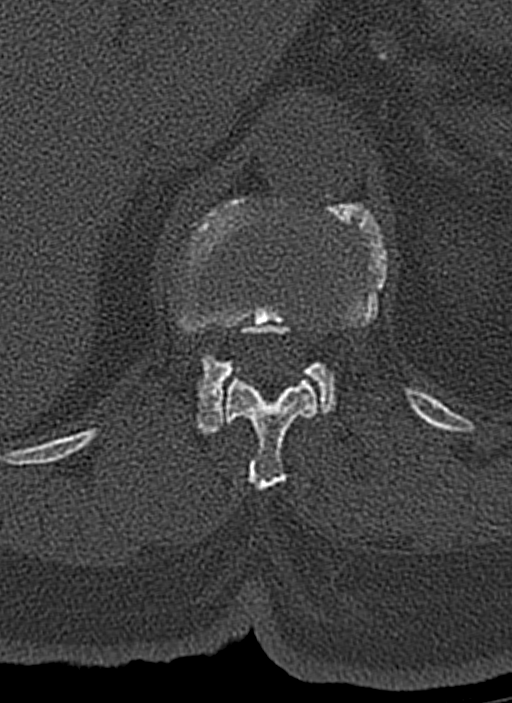

[Series 9: sagittal st · sagittal · 0.41mm/px · 5 of 69 slices shown, 6 images]
[im 23/69  bone]
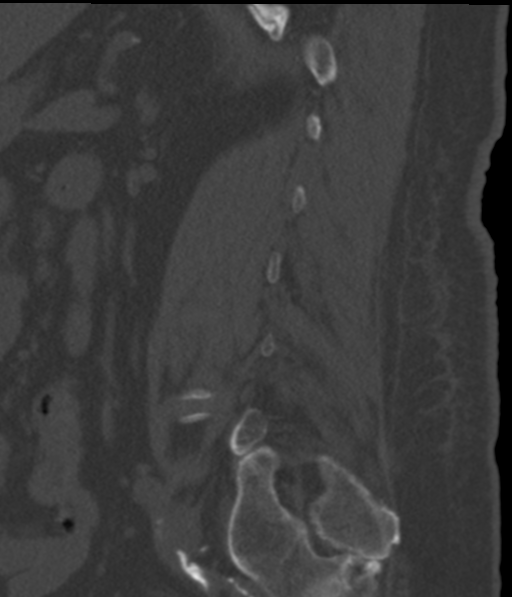
[im 29/69  bone]
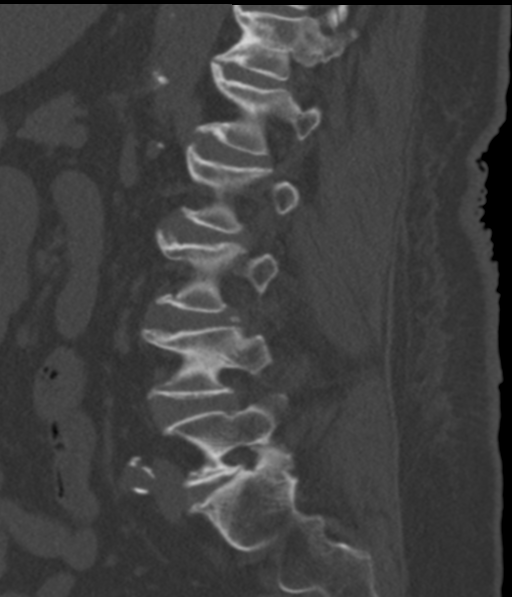
[im 35/69  soft-tissue]
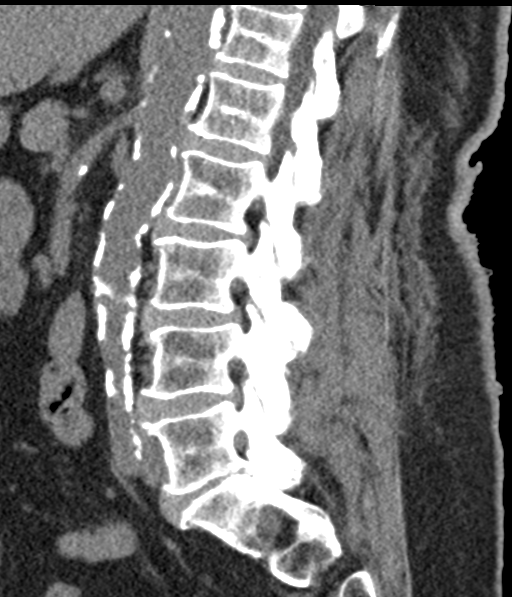
[im 35/69  bone]
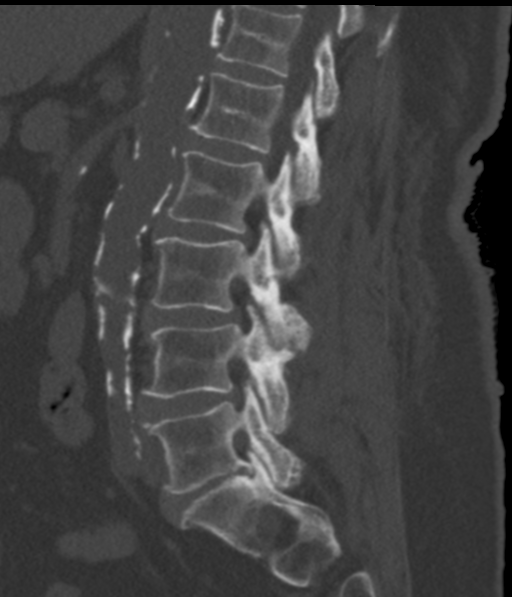
[im 40/69  bone]
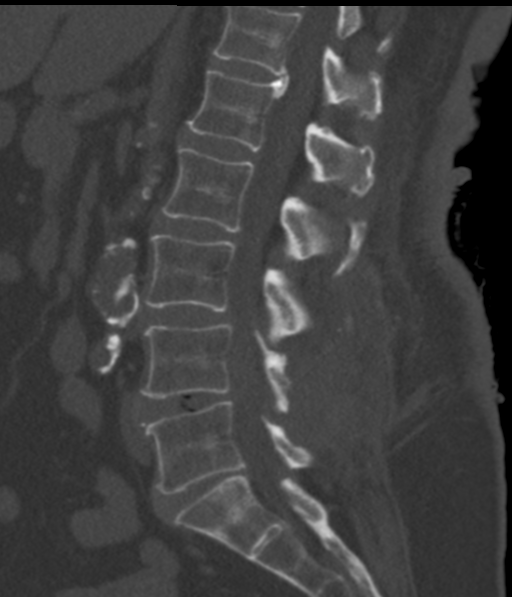
[im 46/69  bone]
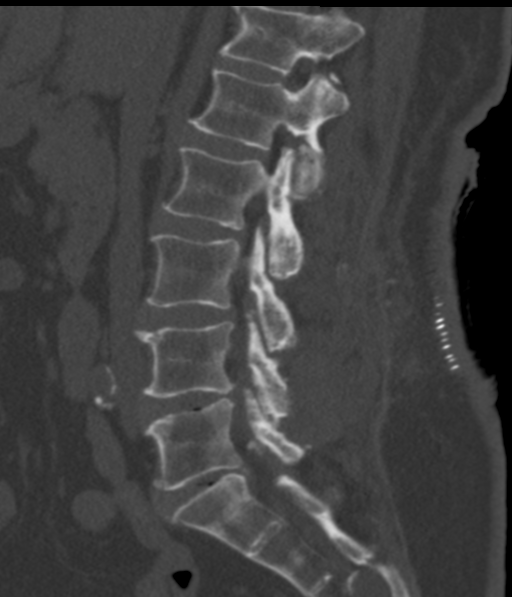

[12 of 33 positions shown; findings below may reference images not displayed]

FINDINGS: Atherosclerotic calcification in the aorta and iliac arteries. No
aneurysm. 3.5 cm left renal cyst. No renal hydronephrosis.

Negative for lumbar spine fracture. Postop changes of laminectomy at
L4-5. Skin staples remain in place.

T12-L1: Disc degeneration and spurring. Mild narrowing of the canal.
Mild facet degeneration.

L1-2: Diffuse bulging of the disc with endplate spurring and mild
facet degeneration.

L2-3: Diffuse disc bulging. Bilateral facet hypertrophy. Mild to
moderate spinal stenosis.

L3-4: 3 mm anterior slip. Diffuse disc bulging. Advanced facet
hypertrophy. Moderately severe spinal stenosis.

L4-5: Bilateral laminectomy. Bilateral facet hypertrophy. Adequate
decompression of the canal. Right foraminal and extra foraminal disc
protrusion which may be affecting the right L4 nerve root. Correlate
with preoperative MRI if available.

No evidence of high-density epidural hematoma. MRI is more sensitive
than CT for detecting fluid collections and hematoma.

L5-S1: Disc degeneration and diffuse endplate osteophyte formation.
Bilateral facet hypertrophy. Mild foraminal narrowing bilaterally.
IMPRESSION: Mild to moderate spinal stenosis at L2-3.

3 mm anterior slip L3-4 with moderately severe spinal stenosis.

Postop bilateral laminectomy L4-5 with adequate decompression of the
canal. Right foraminal and extra foraminal disc protrusion.

No evidence of postop hematoma. Note that MRI is more sensitive than
CT for detecting hematoma and spinal stenosis.

## 2017-01-28 ENCOUNTER — Emergency Department (HOSPITAL_COMMUNITY): Payer: Medicare Other

## 2017-01-28 ENCOUNTER — Encounter (HOSPITAL_COMMUNITY): Payer: Self-pay | Admitting: Emergency Medicine

## 2017-01-28 ENCOUNTER — Emergency Department (HOSPITAL_COMMUNITY)
Admission: EM | Admit: 2017-01-28 | Discharge: 2017-01-28 | Disposition: A | Discharge status: Home / Self Care | Payer: Medicare Other | Attending: Emergency Medicine | Admitting: Emergency Medicine

## 2017-01-28 DIAGNOSIS — S62304A Unspecified fracture of fourth metacarpal bone, right hand, initial encounter for closed fracture: Secondary | ICD-10-CM | POA: Diagnosis not present

## 2017-01-28 DIAGNOSIS — Y92009 Unspecified place in unspecified non-institutional (private) residence as the place of occurrence of the external cause: Secondary | ICD-10-CM | POA: Diagnosis not present

## 2017-01-28 DIAGNOSIS — Z79899 Other long term (current) drug therapy: Secondary | ICD-10-CM | POA: Insufficient documentation

## 2017-01-28 DIAGNOSIS — W0110XA Fall on same level from slipping, tripping and stumbling with subsequent striking against unspecified object, initial encounter: Secondary | ICD-10-CM | POA: Diagnosis not present

## 2017-01-28 DIAGNOSIS — S6992XA Unspecified injury of left wrist, hand and finger(s), initial encounter: Secondary | ICD-10-CM | POA: Diagnosis present

## 2017-01-28 DIAGNOSIS — Y9389 Activity, other specified: Secondary | ICD-10-CM | POA: Diagnosis not present

## 2017-01-28 DIAGNOSIS — E119 Type 2 diabetes mellitus without complications: Secondary | ICD-10-CM | POA: Insufficient documentation

## 2017-01-28 DIAGNOSIS — Z7984 Long term (current) use of oral hypoglycemic drugs: Secondary | ICD-10-CM | POA: Insufficient documentation

## 2017-01-28 DIAGNOSIS — I10 Essential (primary) hypertension: Secondary | ICD-10-CM | POA: Diagnosis not present

## 2017-01-28 DIAGNOSIS — S62306A Unspecified fracture of fifth metacarpal bone, right hand, initial encounter for closed fracture: Secondary | ICD-10-CM | POA: Insufficient documentation

## 2017-01-28 DIAGNOSIS — S20212A Contusion of left front wall of thorax, initial encounter: Secondary | ICD-10-CM | POA: Insufficient documentation

## 2017-01-28 DIAGNOSIS — Z7982 Long term (current) use of aspirin: Secondary | ICD-10-CM | POA: Diagnosis not present

## 2017-01-28 DIAGNOSIS — S62309A Unspecified fracture of unspecified metacarpal bone, initial encounter for closed fracture: Secondary | ICD-10-CM

## 2017-01-28 DIAGNOSIS — S0232XA Fracture of orbital floor, left side, initial encounter for closed fracture: Secondary | ICD-10-CM

## 2017-01-28 DIAGNOSIS — Y999 Unspecified external cause status: Secondary | ICD-10-CM | POA: Diagnosis not present

## 2017-01-28 LAB — CBG MONITORING, ED: Glucose-Capillary: 191 mg/dL — ABNORMAL HIGH (ref 65–99)

## 2017-01-28 MED ORDER — AMOXICILLIN-POT CLAVULANATE 875-125 MG PO TABS: 1.0000 | ORAL_TABLET | Freq: Two times a day (BID) | ORAL | 0 refills | Status: DC

## 2017-01-28 MED ORDER — HYDROCODONE-ACETAMINOPHEN 5-325 MG PO TABS: 1.0000 | ORAL_TABLET | ORAL | 0 refills | Status: DC | PRN

## 2017-01-28 MED ORDER — HYDROCODONE-ACETAMINOPHEN 5-325 MG PO TABS
2.0000 | ORAL_TABLET | Freq: Once | ORAL | Status: AC
  Administered 2017-01-28: 2 via ORAL
  Filled 2017-01-28: qty 2

## 2017-01-28 NOTE — ED Notes (Signed)
Patient transported to X-ray 

## 2017-01-28 NOTE — ED Provider Notes (Signed)
[facility]-EMERGENCY DEPT Provider Note   CSN: [id number] Arrival date & time: 01/28/17  1103     History   Chief Complaint Chief Complaint  Patient presents with  . Fall  . Dizziness  . Hand Pain    HPI [name] is a 83 y.o. female.  HPI Patient slipped going down a hill at her home. She fell forward landing on her left side. She hit her left face, shoulder and hand. She reports she was not knocked out. He has a little bit of a headache. She did abrade the side of her face. No visual changes. She reports she has small amount of pain to the left anterior chest lateral to the upper sternum. No difficulty breathing. She does strain to that area during her fall. She is colonic pain to the left hand. There is been swelling. No abdominal pain and no lower extremity pain. She reports she was ambulatory after the event without difficulty. She is not on any blood thinners. Past Medical History:  Diagnosis Date  . Anemia   . Diabetes mellitus without complication (HCC)   . Difficulty sleeping   . GERD (gastroesophageal reflux disease)   . Hyperlipidemia   . Hypertension   . Sciatic pain   . Shortness of breath dyspnea    with exertion  . Spinal stenosis     Patient Active Problem List   Diagnosis Date Noted  . Spinal stenosis of lumbar region 05/14/2015    Past Surgical History:  Procedure Laterality Date  . ABDOMINAL HYSTERECTOMY  1980's  . LUMBAR LAMINECTOMY/DECOMPRESSION MICRODISCECTOMY N/A 05/14/2015   Procedure: MICRO LUMBAR DECOMPRESSION L4-L5 POSSIBLE L3-L4 ( 2 LEVELS);  Surgeon: Jene Every, MD;  Location: [facility];  Service: Orthopedics;  Laterality: N/A;    OB History    No data available       Home Medications    Prior to Admission medications   Medication Sig Start Date End Date Taking? Authorizing Provider  amoxicillin-clavulanate (AUGMENTIN) 875-125 MG tablet Take 1 tablet by mouth 2 (two) times daily. One po bid x 7 days 01/28/17   Arby Barrette, MD    aspirin 325 MG tablet Take 1 tablet (325 mg total) by mouth daily. Resume 4 days post-op 05/15/15   Dorothy Spark, PA-C  b complex vitamins tablet Take 1 tablet by mouth daily.    [provider]  Calcium Citrate-Vitamin D (CALCIUM + D PO) Take 1 tablet by mouth daily.    [provider]  Cyanocobalamin (B-12) 1000 MCG TBCR Take 1 tablet by mouth daily.    [provider]  docusate sodium (COLACE) 100 MG capsule Take 1 capsule (100 mg total) by mouth 2 (two) times daily as needed for mild constipation. 05/14/15   Jene Every, MD  esomeprazole (NEXIUM) 20 MG capsule Take 20 mg by mouth daily at 12 noon.    [provider]  gabapentin (NEURONTIN) 100 MG capsule Take 100 mg by mouth 3 (three) times daily.  04/14/15   [provider]  hydrochlorothiazide (HYDRODIURIL) 25 MG tablet Take 25 mg by mouth daily. 04/16/15   [provider]  HYDROcodone-acetaminophen (NORCO) 7.5-325 MG tablet Take 1-2 tablets by mouth every 4 (four) hours as needed. Patient taking differently: Take 1-2 tablets by mouth every 4 (four) hours as needed for moderate pain or severe pain.  05/14/15   Jene Every, MD  HYDROcodone-acetaminophen (NORCO/VICODIN) 5-325 MG tablet Take 1-2 tablets by mouth every 4 (four) hours as needed for  moderate pain or severe pain. 01/28/17   Arby Barrette, MD  lisinopril (PRINIVIL,ZESTRIL) 10 MG tablet Take 10 mg by mouth daily. 02/24/15   [provider]  magnesium oxide (MAG-OX) 400 MG tablet Take 400 mg by mouth daily.    [provider]  metFORMIN (GLUCOPHAGE) 1000 MG tablet Take 1,000 mg by mouth daily. 04/16/15   [provider]  methocarbamol (ROBAXIN) 500 MG tablet Take 500 mg by mouth every 6 (six) hours as needed for muscle spasms.    [provider]  Multiple Vitamin (MULTIVITAMIN WITH MINERALS) TABS tablet Take 1 tablet by mouth daily.    [provider]  naproxen (NAPROSYN) 375 MG  tablet Take 1 tablet (375 mg total) by mouth 2 (two) times daily. 05/22/15   Arby Barrette, MD  polyethylene glycol (MIRALAX / GLYCOLAX) packet Take 17 g by mouth 2 (two) times daily.    [provider]  sennosides-docusate sodium (SENOKOT-S) 8.6-50 MG tablet Take 2 tablets by mouth 2 (two) times daily.    [provider]  simvastatin (ZOCOR) 20 MG tablet Take 20 mg by mouth daily. 02/06/15   [provider]    Family History No family history on file.  Social History Social History  Substance Use Topics  . Smoking status: Never Smoker  . Smokeless tobacco: Never Used  . Alcohol use No     Allergies   Patient has no known allergies.   Review of Systems Review of Systems 10 Systems reviewed and are negative for acute change except as noted in the HPI.  Physical Exam Updated Vital Signs BP (!) 160/73 (BP Location: Right Arm)   Pulse 76   Temp 97.6 F (36.4 C) (Oral)   Resp 19   Ht 5\' 4"  (1.626 m)   Wt 95.3 kg (210 lb)   SpO2 99%   BMI 36.05 kg/m   Physical Exam  Constitutional: She is oriented to person, place, and time. She appears well-developed and well-nourished. No distress.  Patient is alert and nontoxic. No respiratory distress.  HENT:  Head: Normocephalic and atraumatic.  Nose: Nose normal.  Superficial abrasions to the left temple and left zygomatic arch. No significant amount of hematoma or swelling. Normal range of motion of the jaw.  Eyes: Pupils are equal, round, and reactive to light. Conjunctivae and EOM are normal.  Neck: Neck supple.  C-spine tenderness C6-C7.  Cardiovascular: Normal rate, regular rhythm, normal heart sounds and intact distal pulses.   No murmur heard. Pulmonary/Chest: Effort normal and breath sounds normal. No respiratory distress.  Mild focal chest wall tenderness to the anterior chest just left of the sternum in the upper ribs. No crepitus or deformity. Patient has no tenderness to significant  compression of the thoracic wall in the lateral aspects or anteriorly in the lower ribs.  Abdominal: Soft. She exhibits no distension. There is no tenderness. There is no guarding.  Musculoskeletal: She exhibits no edema.  Moderate swelling ulnar aspect of left hand. Several superficial abrasions to the fifth digit. No deformity of the digits. Wrist and upper arm normal. Lower extremities no injuries.  Neurological: She is alert and oriented to person, place, and time. No cranial nerve deficit. She exhibits normal muscle tone. Coordination normal.  Skin: Skin is warm and dry.  Psychiatric: She has a normal mood and affect.  Nursing note and vitals reviewed.    ED Treatments / Results  Labs (all labs ordered are listed, but only abnormal results are displayed) Labs  Reviewed  CBG MONITORING, ED - Abnormal; Notable for the following:       Result Value   Glucose-Capillary 191 (*)    All other components within normal limits    EKG  EKG Interpretation None       Radiology Dg Chest 2 View  Result Date: 01/28/2017 CLINICAL DATA:  Pain following fall EXAM: CHEST  2 VIEW COMPARISON:  None. FINDINGS: There is no edema or consolidation. The heart size is upper normal with pulmonary vascularity within normal limits. There is aortic atherosclerosis. No adenopathy. No pneumothorax. There is mild degenerative change in the thoracic spine. IMPRESSION: No edema or consolidation.  There is aortic atherosclerosis. Aortic Atherosclerosis (ICD10-I70.0). Electronically Signed   By: Bretta BangWilliam  Woodruff III M.D.   On: 01/28/2017 13:29   Ct Head Wo Contrast  Result Date: 01/28/2017 CLINICAL DATA:  Pt reports losing balance while walking on hill outside, falling and becoming dizzy after fall, denies LOC. Pt states had headache left side and frontal. Pt denies neck pain. EXAM: CT HEAD WITHOUT CONTRAST CT CERVICAL SPINE WITHOUT CONTRAST TECHNIQUE: Multidetector CT imaging of the head and cervical spine was  performed following the standard protocol without intravenous contrast. Multiplanar CT image reconstructions of the cervical spine were also generated. COMPARISON:  None. FINDINGS: CT HEAD FINDINGS Brain: Diffuse parenchymal atrophy. Patchy areas of hypoattenuation in deep and periventricular white matter bilaterally. Negative for acute intracranial hemorrhage, mass lesion, acute infarction, midline shift, or mass-effect. Acute infarct may be inapparent on noncontrast CT. Ventricles and sulci symmetric. Vascular: Atherosclerotic and physiologic intracranial calcifications. Skull: Normal. Negative for fracture or focal lesion. Sinuses/Orbits: Fracture of the left orbital floor with minimal herniation of orbital fat but no evidence entrapment. Minimally distracted fracture of the lateral wall of left orbit. Fluid level in the left maxillary sinus. Other: None. CT CERVICAL SPINE FINDINGS Alignment: Normal. Skull base and vertebrae: No acute fracture. No primary bone lesion or focal pathologic process. Soft tissues and spinal canal: No prevertebral fluid or swelling. No evident hematoma. Bilateral carotid bifurcation calcified plaque. Disc levels: Asymmetric facet DJD left worse than right C2-C6. Mild narrowing of the C6-7 interspace. Anterior endplate spurring [id number]. Upper chest: Negative. Other: None IMPRESSION: 1.   1.  Negative for bleed or other acute intracranial process. 2. Atrophy and nonspecific white matter changes. 3. Fracture of the left orbital floor and lateral wall without evidence of entrapment. 4. Negative for cervical fracture or other acute bone abnormality. 5. Cervical spondylitic changes C2-C7 as above. 2. Electronically Signed   By: Corlis Leak  Hassell M.D.   On: 01/28/2017 14:00   Ct Cervical Spine Wo Contrast  Result Date: 01/28/2017 CLINICAL DATA:  Pt reports losing balance while walking on hill outside, falling and becoming dizzy after fall, denies LOC. Pt states had headache left side and frontal.  Pt denies neck pain. EXAM: CT HEAD WITHOUT CONTRAST CT CERVICAL SPINE WITHOUT CONTRAST TECHNIQUE: Multidetector CT imaging of the head and cervical spine was performed following the standard protocol without intravenous contrast. Multiplanar CT image reconstructions of the cervical spine were also generated. COMPARISON:  None. FINDINGS: CT HEAD FINDINGS Brain: Diffuse parenchymal atrophy. Patchy areas of hypoattenuation in deep and periventricular white matter bilaterally. Negative for acute intracranial hemorrhage, mass lesion, acute infarction, midline shift, or mass-effect. Acute infarct may be inapparent on noncontrast CT. Ventricles and sulci symmetric. Vascular: Atherosclerotic and physiologic intracranial calcifications. Skull: Normal. Negative for fracture or focal lesion. Sinuses/Orbits: Fracture of the left orbital floor with minimal herniation  of orbital fat but no evidence entrapment. Minimally distracted fracture of the lateral wall of left orbit. Fluid level in the left maxillary sinus. Other: None. CT CERVICAL SPINE FINDINGS Alignment: Normal. Skull base and vertebrae: No acute fracture. No primary bone lesion or focal pathologic process. Soft tissues and spinal canal: No prevertebral fluid or swelling. No evident hematoma. Bilateral carotid bifurcation calcified plaque. Disc levels: Asymmetric facet DJD left worse than right C2-C6. Mild narrowing of the C6-7 interspace. Anterior endplate spurring [id number]. Upper chest: Negative. Other: None IMPRESSION: 1.   1.  Negative for bleed or other acute intracranial process. 2. Atrophy and nonspecific white matter changes. 3. Fracture of the left orbital floor and lateral wall without evidence of entrapment. 4. Negative for cervical fracture or other acute bone abnormality. 5. Cervical spondylitic changes C2-C7 as above. 2. Electronically Signed   By: Corlis Leak M.D.   On: 01/28/2017 14:00   Dg Shoulder Left  Result Date: 01/28/2017 CLINICAL DATA:  Pt c/o  left shoulder pain and left hand pain after a fall today PTA. Pt was walking down a hill and fell, landing on her left side. Pt denies previous injury. Hx HTN, diabetes. EXAM: LEFT SHOULDER - 2+ VIEW COMPARISON:  None. FINDINGS: Osseous alignment is normal. No fracture line or displaced fracture fragment seen. Calcifications overlying the lateral margin of the left humeral head are indicative of chronic calcific tendinopathy of the rotator cuff insertion. IMPRESSION: 1. No acute findings.  No osseous fracture or dislocation. 2. Chronic calcific tendinopathy of the rotator cuff complex. Electronically Signed   By: Bary Richard M.D.   On: 01/28/2017 13:31   Dg Hand Complete Left  Result Date: 01/28/2017 CLINICAL DATA:  Pt was walking down the hill at her house today and slipped, falling and landing on her left hand as a result. She has a posterior medial left hand pain, bruising, and lacerations that spread laterally, starting on her pinky. EXAM: LEFT HAND - COMPLETE 3+ VIEW COMPARISON:  None. FINDINGS: There is an oblique comminuted fracture of the base of the fifth metacarpal, associated with minimal displacement. There is a fracture at the base of the fourth metacarpal. There is associated soft tissue swelling. No radiopaque foreign body or soft tissue gas. IMPRESSION: Fractures involving the bases of the fourth and fifth metacarpals. Electronically Signed   By: Norva Pavlov M.D.   On: 01/28/2017 11:57    Procedures Procedures (including critical care time)  Medications Ordered in ED Medications  HYDROcodone-acetaminophen (NORCO/VICODIN) 5-325 MG per tablet 2 tablet (2 tablets Oral Given 01/28/17 1306)     Initial Impression / Assessment and Plan / ED Course  I have reviewed the triage vital signs and the nursing notes.  Pertinent labs & imaging results that were available during my care of the patient were reviewed by me and considered in my medical decision making (see chart for  details).      Final Clinical Impressions(s) / ED Diagnoses   Final diagnoses:  Multiple closed fractures of single metacarpal bone, initial encounter  Fracture of orbital floor, left side, initial encounter for closed fracture (HCC)  Contusion of left chest wall, initial encounter   Patient mechanical fall. He had does not show intracranial injury. She does have nondisplaced orbital floor fracture. Extraocular motions are normal. There are superficial abrasions to the side of the face. A shunt also has metacarpal fracture with some overlying abrasions. Hand is splinted. Patient has mild anterior chest wall tenderness with most likely  due to strain during the fall. I doubt any rib fractures given she does not have tenderness with significant compression action on the chest wall. Patient got pain improvement with 1 Vicodin. He'll be given Vicodin to take 1-2 as needed for pain. Patient does have superficial abrasions without any lacerations requiring repair on the face and the hand she will empirically be given Augmentin to take. Plan will be for follow-up with orthopedics and ophthalmology. New Prescriptions New Prescriptions   AMOXICILLIN-CLAVULANATE (AUGMENTIN) 875-125 MG TABLET    Take 1 tablet by mouth 2 (two) times daily. One po bid x 7 days   HYDROCODONE-ACETAMINOPHEN (NORCO/VICODIN) 5-325 MG TABLET    Take 1-2 tablets by mouth every 4 (four) hours as needed for moderate pain or severe pain.     Arby Barrette, MD 01/28/17 [id number]

## 2017-01-28 NOTE — ED Notes (Signed)
CBG 191 

## 2017-01-28 NOTE — ED Notes (Signed)
Splint and sling applied by ortho technician.

## 2017-01-28 NOTE — Progress Notes (Signed)
Orthopedic Tech Progress Note Patient Details:  Martha CabalLela B Koman 05/17/1934 [id number]  Ortho Devices Type of Ortho Device: Ace wrap, Arm sling, Ulna gutter splint Ortho Device/Splint Location: lue Ortho Device/Splint Interventions: Application   Chistina Roston 01/28/2017, 4:08 PM

## 2017-01-28 NOTE — ED Triage Notes (Signed)
Pt reports losing balance while walking on hill outside, falling and becoming dizzy after fall, denies LOC, CP, SOB, . Pt reports pain to L hand, swelling noted. Abrasions noted to L face and hand. Pt states vision is blurred but also states can't use glasses d/t abrasion on face. NAD noted, resp e/u.

## 2017-07-27 ENCOUNTER — Encounter: Payer: Self-pay | Admitting: Neurology

## 2017-07-28 ENCOUNTER — Encounter: Payer: Self-pay | Admitting: Neurology

## 2017-07-28 ENCOUNTER — Ambulatory Visit: Payer: Medicare Other | Admitting: Neurology

## 2017-07-28 VITALS — BP 129/89 | HR 82 | Ht 64.0 in | Wt 208.0 lb

## 2017-07-28 DIAGNOSIS — F810 Specific reading disorder: Secondary | ICD-10-CM | POA: Diagnosis not present

## 2017-07-28 DIAGNOSIS — G3184 Mild cognitive impairment, so stated: Secondary | ICD-10-CM

## 2017-07-28 NOTE — Progress Notes (Signed)
Provider:  Melvyn Novas, M D  Referring Provider: Charlies Silvers, PA   Primary Care Physician:  Margaretann Loveless, PA-C  Chief Complaint  Patient presents with  . New Patient (Initial Visit)    pt with daughter, rm 82. pt has had worsening memory and vision difficulty that has been pressnt for a month.    HPI:  [name] is a 84 y.o. female patient of native [place] ancestry [place])  seen here in a referral from Dr. Rosezetta Schlatter for evaluation of memory loss .  I have the pleasure to meet [name] and her daughter in law today, referred for a objective feeling that she has progressive memory loss.  The patient had a fall in the second half of last year 2018 in which she hit her left cheekbone and temple she even suffered an orbital fracture.  She believes that since then her memory has been less exact, less prominent.  She was apparently seen in the [facility] emergency room, where a CT of the head was obtained which did not show any brain related injuries.  However she did need an office ophthalmological evaluation assuring that her facial skull fracture was not hurting her left eye. The patient also did not need surgical intervention.  After the fall in July 2018, she complained of dizziness, numbness of the nose, and the feeling that her memory slept.  In her referral notes by physician assistant Fransisca Connors from [facility],  there is a Mini-Mental Status Examination in which the patient scored 20 out of 25 points, corrected for her level of education ( second grade- functional analphabetic). She reached today with the same score of 20 out of 25. She has continued trouble to control her diabetes.   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 Father was ill and couldn't work. She got married at age 82, having 6 children herself.  Remote smoker-quit 15 years ago, does not drink any alcohol and never has,  once in a while she will drink a cup of coffee -she does drink tea, she gave up soda many years ago.    Review of Systems: Out of a complete 14 system review, the patient complains of only the following symptoms, and all other reviewed systems are negative.  Related to changes that were evident right after her fall and facial skull injury she only reports numbness of the nose, some dizziness but no diplopia, vision loss, hearing loss.  Social History   Socioeconomic History  . Marital status: Widowed    Spouse name: Not on file  . Number of children: Not on file  . Years of education: Not on file  . Highest education level: Not on file  Social Needs  . Financial resource strain: Not on file  . Food insecurity - worry: Not on file  . Food insecurity - inability: Not on file  . Transportation needs - medical: Not on file  . Transportation needs - non-medical: Not on file  Occupational History  . Not on file  Tobacco Use  . Smoking status: Never Smoker  . Smokeless tobacco: Never Used  Substance and Sexual Activity  . Alcohol use: No  . Drug use: No  . Sexual activity: Not on file  Other Topics Concern  . Not on file  Social History Narrative  . Not on file    Family History  Problem Relation Age of  Onset  . Hypertension Mother     Past Medical History:  Diagnosis Date  . Anemia   . Diabetes mellitus without complication (HCC)   . Difficulty sleeping   . GERD (gastroesophageal reflux disease)   . Hyperlipidemia   . Hypertension   . Sciatic pain   . Shortness of breath dyspnea    with exertion  . Spinal stenosis     Past Surgical History:  Procedure Laterality Date  . ABDOMINAL HYSTERECTOMY  1980's  . LUMBAR LAMINECTOMY/DECOMPRESSION MICRODISCECTOMY N/A 05/14/2015   Procedure: MICRO LUMBAR DECOMPRESSION L4-L5 POSSIBLE L3-L4 ( 2 LEVELS);  Surgeon: Jene Every, MD;  Location: [facility] ORS;  Service: Orthopedics;  Laterality: N/A;    Current Outpatient Medications   Medication Sig Dispense Refill  . aspirin 325 MG tablet Take 1 tablet (325 mg total) by mouth daily. Resume 4 days post-op    . b complex vitamins tablet Take 1 tablet by mouth daily.    . Calcium Citrate-Vitamin D (CALCIUM + D PO) Take 1 tablet by mouth daily.    . clobetasol (TEMOVATE) 0.05 % external solution Apply topically.    . Cyanocobalamin (B-12) 1000 MCG TBCR Take 1 tablet by mouth daily.    Marland Kitchen docusate sodium (COLACE) 100 MG capsule Take 1 capsule (100 mg total) by mouth 2 (two) times daily as needed for mild constipation. 20 capsule 1  . esomeprazole (NEXIUM) 20 MG capsule Take 20 mg by mouth daily at 12 noon.    . gabapentin (NEURONTIN) 100 MG capsule Take 100 mg by mouth 3 (three) times daily.   1  . glimepiride (AMARYL) 2 MG tablet Take 6 mg by mouth.    Marland Kitchen glucose blood (ONE TOUCH ULTRA TEST) test strip TEST 2 TIMES DAILY    . hydrochlorothiazide (HYDRODIURIL) 25 MG tablet Take 25 mg by mouth daily.  5  . lisinopril (PRINIVIL,ZESTRIL) 10 MG tablet Take 10 mg by mouth daily.  2  . magnesium oxide (MAG-OX) 400 MG tablet Take 400 mg by mouth daily.    . metFORMIN (GLUCOPHAGE) 1000 MG tablet Take 1,000 mg by mouth daily.  8  . Multiple Vitamin (MULTIVITAMIN WITH MINERALS) TABS tablet Take 1 tablet by mouth daily.    . sennosides-docusate sodium (SENOKOT-S) 8.6-50 MG tablet Take 2 tablets by mouth 2 (two) times daily.    . simvastatin (ZOCOR) 20 MG tablet Take 20 mg by mouth daily.  1  . Vitamins/Minerals TABS Take by mouth.     No current facility-administered medications for this visit.     Allergies as of 07/28/2017  . (No Active Allergies)    Vitals: BP 129/89   Pulse 82   Ht 5\' 4"  (1.626 m)   Wt 208 lb (94.3 kg)   BMI 35.70 kg/m  Last Weight:  Wt Readings from Last 1 Encounters:  07/28/17 208 lb (94.3 kg)   Last Height:   Ht Readings from Last 1 Encounters:  07/28/17 5\' 4"  (1.626 m)    Physical exam:  General: The patient is awake, alert and appears not in  acute distress. The patient is well groomed. Head: Normocephalic, atraumatic. Neck is supple. Mallampati 5, neck circumference: 17 Cardiovascular:  Regular rate and rhythm , without  murmurs or carotid bruit, and without distended neck veins. Respiratory: Lungs are clear to auscultation. Skin:  Without evidence of edema, or rash Trunk: BMI is elevated - the  patient  has normal posture.  Neurologic exam : The patient is awake and alert, oriented  to place and time.  Memory subjective described as impaired - There is a normal attention span & concentration ability. Speech is fluent without dysarthria, dysphonia or aphasia. Mood and affect are appropriate.  Cranial nerves: Pupils are equal and briskly reactive to light. Funduscopic exam without  evidence of pallor or edema. Extraocular movements  in vertical and horizontal planes intact and without nystagmus. Visual fields by finger perimetry are intact.Hearing to finger rub intact.  Facial sensation intact to fine touch. Facial motor strength is symmetric and tongue and uvula move midline. Tongue protrusion into either cheek is normal. Shoulder shrug is normal.   Motor exam:   Normal tone ,muscle bulk and symmetric  strength in all extremities. Sensory:  Fine touch, pinprick and vibration were tested in upper  extremities. Proprioception was normal. Coordination: Rapid alternating movements in the fingers/hands were normal. Finger-to-nose maneuver  normal without evidence of ataxia, dysmetria or tremor. Gait and station: Patient walks without assistive device- she needed to brace herself to rise form the chair. Strength within normal limits. Stance is stable and normal.  [name] was able to turn his 4 steps, she did not appear to drift good arm swing, no shuffling.   Deep tendon reflexes: in the upper and lower extremities are symmetric and intact.   Assessment:  After physical and neurologic examination, review of laboratory studies, imaging,  neurophysiology testing and pre-existing records, assessment is that of :   I can neither confirm nor deny that [name] may have suffered a concussion and postconcussion syndrome.  What I can see is that her CT did not show any bleed, not even bruising of the brain.  The symptoms she described happening after that fall and facial injury can be related to peripheral injury such as a numbness at the nose bridge some focal headaches, some dizziness.    As to her memory and not sure that anything changed but I do not have a comparison study.  I would like to repeat a Mini-Mental Status Examination again adjusted for level of education in 10-12 months.  Only this way will be find out if there is a true decline in memory over time ( there was none over the last 3 months).  Plan:  Treatment plan and additional workup : Rv with MMSE in 10-12 month      Porfirio Mylararmen Emberlin Verner MD 07/28/2017

## 2017-08-02 ENCOUNTER — Other Ambulatory Visit: Payer: Self-pay | Admitting: Gastroenterology

## 2017-08-02 DIAGNOSIS — R945 Abnormal results of liver function studies: Principal | ICD-10-CM

## 2017-08-02 DIAGNOSIS — R7989 Other specified abnormal findings of blood chemistry: Secondary | ICD-10-CM

## 2017-08-09 ENCOUNTER — Ambulatory Visit
Admission: RE | Admit: 2017-08-09 | Discharge: 2017-08-09 | Disposition: A | Discharge status: Home / Self Care | Payer: Medicare Other | Source: Ambulatory Visit | Attending: Gastroenterology | Admitting: Gastroenterology

## 2017-08-09 DIAGNOSIS — R945 Abnormal results of liver function studies: Principal | ICD-10-CM

## 2017-08-09 DIAGNOSIS — R7989 Other specified abnormal findings of blood chemistry: Secondary | ICD-10-CM

## 2018-04-17 ENCOUNTER — Other Ambulatory Visit: Payer: Self-pay | Admitting: Gastroenterology

## 2018-04-17 DIAGNOSIS — R1310 Dysphagia, unspecified: Secondary | ICD-10-CM

## 2018-04-20 ENCOUNTER — Ambulatory Visit (HOSPITAL_COMMUNITY)
Admission: RE | Admit: 2018-04-20 | Discharge: 2018-04-20 | Disposition: A | Discharge status: Home / Self Care | Payer: Medicare Other | Source: Ambulatory Visit | Attending: Gastroenterology | Admitting: Gastroenterology

## 2018-04-20 DIAGNOSIS — R1310 Dysphagia, unspecified: Secondary | ICD-10-CM | POA: Insufficient documentation

## 2018-04-20 DIAGNOSIS — K219 Gastro-esophageal reflux disease without esophagitis: Secondary | ICD-10-CM | POA: Diagnosis not present

## 2018-08-01 ENCOUNTER — Telehealth: Payer: Self-pay | Admitting: *Deleted

## 2018-08-01 ENCOUNTER — Encounter: Payer: Self-pay | Admitting: *Deleted

## 2018-08-01 ENCOUNTER — Ambulatory Visit: Payer: Medicare Other | Admitting: Adult Health

## 2018-08-01 NOTE — Telephone Encounter (Signed)
Patient was no show for follow up with NP today.  

## 2018-08-02 ENCOUNTER — Encounter: Payer: Self-pay | Admitting: Adult Health

## 2019-10-18 ENCOUNTER — Ambulatory Visit: Payer: Medicare Other

## 2019-10-24 ENCOUNTER — Ambulatory Visit: Payer: Medicare HMO | Attending: Internal Medicine

## 2019-10-24 DIAGNOSIS — Z23 Encounter for immunization: Secondary | ICD-10-CM

## 2019-10-24 NOTE — Progress Notes (Signed)
   Covid-19 Vaccination Clinic  Name:  AELA BOHAN    MRN: [id number] DOB: Mar 28, 1934  10/24/2019  Ms. Lipsey was observed post Covid-19 immunization for 15 minutes without incident. She was provided with Vaccine Information Sheet and instruction to access the V-Safe system.   Ms. Santalucia was instructed to call 911 with any severe reactions post vaccine: Marland Kitchen Difficulty breathing  . Swelling of face and throat  . A fast heartbeat  . A bad rash all over body  . Dizziness and weakness   Immunizations Administered    Name Date Dose VIS Date Route   Moderna COVID-19 Vaccine 10/24/2019 10:47 AM 0.5 mL 06/19/2019 Intramuscular   Manufacturer: Gala Murdoch   Lot: [id number]   NDC: 01642-903-79

## 2021-02-10 DIAGNOSIS — M5416 Radiculopathy, lumbar region: Secondary | ICD-10-CM | POA: Diagnosis not present

## 2021-02-19 DIAGNOSIS — M5416 Radiculopathy, lumbar region: Secondary | ICD-10-CM | POA: Diagnosis not present

## 2021-03-11 DIAGNOSIS — M5416 Radiculopathy, lumbar region: Secondary | ICD-10-CM | POA: Diagnosis not present

## 2021-03-11 DIAGNOSIS — M5459 Other low back pain: Secondary | ICD-10-CM | POA: Diagnosis not present

## 2021-03-30 DIAGNOSIS — M545 Low back pain, unspecified: Secondary | ICD-10-CM | POA: Diagnosis not present

## 2021-04-02 DIAGNOSIS — L309 Dermatitis, unspecified: Secondary | ICD-10-CM | POA: Diagnosis not present

## 2021-04-02 DIAGNOSIS — E1159 Type 2 diabetes mellitus with other circulatory complications: Secondary | ICD-10-CM | POA: Diagnosis not present

## 2021-04-02 DIAGNOSIS — I10 Essential (primary) hypertension: Secondary | ICD-10-CM | POA: Diagnosis not present

## 2021-04-02 DIAGNOSIS — R6889 Other general symptoms and signs: Secondary | ICD-10-CM | POA: Diagnosis not present

## 2021-04-02 DIAGNOSIS — K219 Gastro-esophageal reflux disease without esophagitis: Secondary | ICD-10-CM | POA: Diagnosis not present

## 2021-04-02 DIAGNOSIS — E785 Hyperlipidemia, unspecified: Secondary | ICD-10-CM | POA: Diagnosis not present

## 2021-04-02 DIAGNOSIS — E7800 Pure hypercholesterolemia, unspecified: Secondary | ICD-10-CM | POA: Diagnosis not present

## 2021-04-13 DIAGNOSIS — M5416 Radiculopathy, lumbar region: Secondary | ICD-10-CM | POA: Diagnosis not present

## 2021-05-06 DIAGNOSIS — R1013 Epigastric pain: Secondary | ICD-10-CM | POA: Diagnosis not present

## 2021-05-06 DIAGNOSIS — H539 Unspecified visual disturbance: Secondary | ICD-10-CM | POA: Diagnosis not present

## 2021-05-06 DIAGNOSIS — R519 Headache, unspecified: Secondary | ICD-10-CM | POA: Diagnosis not present

## 2021-05-06 DIAGNOSIS — I10 Essential (primary) hypertension: Secondary | ICD-10-CM | POA: Diagnosis not present

## 2021-05-06 DIAGNOSIS — K7581 Nonalcoholic steatohepatitis (NASH): Secondary | ICD-10-CM | POA: Diagnosis not present

## 2021-05-06 DIAGNOSIS — K5790 Diverticulosis of intestine, part unspecified, without perforation or abscess without bleeding: Secondary | ICD-10-CM | POA: Diagnosis not present

## 2021-05-06 DIAGNOSIS — K219 Gastro-esophageal reflux disease without esophagitis: Secondary | ICD-10-CM | POA: Diagnosis not present

## 2021-05-06 DIAGNOSIS — R1114 Bilious vomiting: Secondary | ICD-10-CM | POA: Diagnosis not present

## 2021-05-06 DIAGNOSIS — R1011 Right upper quadrant pain: Secondary | ICD-10-CM | POA: Diagnosis not present

## 2021-05-06 DIAGNOSIS — R109 Unspecified abdominal pain: Secondary | ICD-10-CM | POA: Diagnosis not present

## 2021-06-01 DIAGNOSIS — R519 Headache, unspecified: Secondary | ICD-10-CM | POA: Diagnosis not present

## 2021-06-01 DIAGNOSIS — K7689 Other specified diseases of liver: Secondary | ICD-10-CM | POA: Diagnosis not present

## 2021-06-01 DIAGNOSIS — R29818 Other symptoms and signs involving the nervous system: Secondary | ICD-10-CM | POA: Diagnosis not present

## 2021-06-01 DIAGNOSIS — H539 Unspecified visual disturbance: Secondary | ICD-10-CM | POA: Diagnosis not present

## 2021-06-01 DIAGNOSIS — N281 Cyst of kidney, acquired: Secondary | ICD-10-CM | POA: Diagnosis not present

## 2021-06-01 DIAGNOSIS — K760 Fatty (change of) liver, not elsewhere classified: Secondary | ICD-10-CM | POA: Diagnosis not present

## 2021-06-01 DIAGNOSIS — R109 Unspecified abdominal pain: Secondary | ICD-10-CM | POA: Diagnosis not present

## 2021-06-01 DIAGNOSIS — G8929 Other chronic pain: Secondary | ICD-10-CM | POA: Diagnosis not present

## 2021-06-16 DIAGNOSIS — H5203 Hypermetropia, bilateral: Secondary | ICD-10-CM | POA: Diagnosis not present

## 2021-06-16 DIAGNOSIS — H25813 Combined forms of age-related cataract, bilateral: Secondary | ICD-10-CM | POA: Diagnosis not present

## 2021-06-16 DIAGNOSIS — H524 Presbyopia: Secondary | ICD-10-CM | POA: Diagnosis not present

## 2021-06-16 DIAGNOSIS — H52223 Regular astigmatism, bilateral: Secondary | ICD-10-CM | POA: Diagnosis not present

## 2021-06-16 DIAGNOSIS — E113293 Type 2 diabetes mellitus with mild nonproliferative diabetic retinopathy without macular edema, bilateral: Secondary | ICD-10-CM | POA: Diagnosis not present

## 2021-06-17 DIAGNOSIS — H02831 Dermatochalasis of right upper eyelid: Secondary | ICD-10-CM | POA: Diagnosis not present

## 2021-06-17 DIAGNOSIS — H43813 Vitreous degeneration, bilateral: Secondary | ICD-10-CM | POA: Diagnosis not present

## 2021-06-17 DIAGNOSIS — H2513 Age-related nuclear cataract, bilateral: Secondary | ICD-10-CM | POA: Diagnosis not present

## 2021-06-17 DIAGNOSIS — H11153 Pinguecula, bilateral: Secondary | ICD-10-CM | POA: Diagnosis not present

## 2023-04-08 ENCOUNTER — Observation Stay (HOSPITAL_COMMUNITY)
Admission: EM | Admit: 2023-04-08 | Discharge: 2023-04-10 | Disposition: A | Discharge status: Home Health | Payer: Medicare Other | Attending: Internal Medicine | Admitting: Internal Medicine

## 2023-04-08 ENCOUNTER — Emergency Department (HOSPITAL_COMMUNITY): Payer: Medicare Other

## 2023-04-08 ENCOUNTER — Encounter (HOSPITAL_COMMUNITY): Payer: Self-pay

## 2023-04-08 ENCOUNTER — Other Ambulatory Visit: Payer: Self-pay

## 2023-04-08 DIAGNOSIS — N179 Acute kidney failure, unspecified: Secondary | ICD-10-CM | POA: Insufficient documentation

## 2023-04-08 DIAGNOSIS — E782 Mixed hyperlipidemia: Secondary | ICD-10-CM | POA: Insufficient documentation

## 2023-04-08 DIAGNOSIS — Z7982 Long term (current) use of aspirin: Secondary | ICD-10-CM | POA: Diagnosis not present

## 2023-04-08 DIAGNOSIS — E876 Hypokalemia: Secondary | ICD-10-CM | POA: Diagnosis not present

## 2023-04-08 DIAGNOSIS — E1165 Type 2 diabetes mellitus with hyperglycemia: Secondary | ICD-10-CM | POA: Diagnosis not present

## 2023-04-08 DIAGNOSIS — Z9181 History of falling: Secondary | ICD-10-CM | POA: Insufficient documentation

## 2023-04-08 DIAGNOSIS — R4701 Aphasia: Secondary | ICD-10-CM | POA: Diagnosis not present

## 2023-04-08 DIAGNOSIS — G459 Transient cerebral ischemic attack, unspecified: Principal | ICD-10-CM | POA: Diagnosis present

## 2023-04-08 DIAGNOSIS — R2681 Unsteadiness on feet: Secondary | ICD-10-CM | POA: Insufficient documentation

## 2023-04-08 DIAGNOSIS — I6381 Other cerebral infarction due to occlusion or stenosis of small artery: Secondary | ICD-10-CM | POA: Diagnosis not present

## 2023-04-08 DIAGNOSIS — K219 Gastro-esophageal reflux disease without esophagitis: Secondary | ICD-10-CM | POA: Insufficient documentation

## 2023-04-08 DIAGNOSIS — Z7984 Long term (current) use of oral hypoglycemic drugs: Secondary | ICD-10-CM | POA: Diagnosis not present

## 2023-04-08 DIAGNOSIS — M6281 Muscle weakness (generalized): Secondary | ICD-10-CM | POA: Diagnosis not present

## 2023-04-08 DIAGNOSIS — I6523 Occlusion and stenosis of bilateral carotid arteries: Secondary | ICD-10-CM | POA: Diagnosis not present

## 2023-04-08 DIAGNOSIS — E669 Obesity, unspecified: Secondary | ICD-10-CM | POA: Insufficient documentation

## 2023-04-08 DIAGNOSIS — I10 Essential (primary) hypertension: Secondary | ICD-10-CM | POA: Diagnosis not present

## 2023-04-08 DIAGNOSIS — I6611 Occlusion and stenosis of right anterior cerebral artery: Secondary | ICD-10-CM | POA: Diagnosis not present

## 2023-04-08 DIAGNOSIS — R1310 Dysphagia, unspecified: Secondary | ICD-10-CM | POA: Diagnosis present

## 2023-04-08 DIAGNOSIS — E869 Volume depletion, unspecified: Secondary | ICD-10-CM | POA: Insufficient documentation

## 2023-04-08 DIAGNOSIS — R2689 Other abnormalities of gait and mobility: Secondary | ICD-10-CM | POA: Insufficient documentation

## 2023-04-08 DIAGNOSIS — Z79899 Other long term (current) drug therapy: Secondary | ICD-10-CM | POA: Insufficient documentation

## 2023-04-08 LAB — URINALYSIS, ROUTINE W REFLEX MICROSCOPIC
Bacteria, UA: NONE SEEN
Bilirubin Urine: NEGATIVE
Glucose, UA: NEGATIVE mg/dL
Ketones, ur: NEGATIVE mg/dL
Leukocytes,Ua: NEGATIVE
Nitrite: NEGATIVE
Protein, ur: 100 mg/dL — AB
Specific Gravity, Urine: 1.039 — ABNORMAL HIGH (ref 1.005–1.030)
pH: 5 (ref 5.0–8.0)

## 2023-04-08 LAB — DIFFERENTIAL
Abs Immature Granulocytes: 0.03 10*3/uL (ref 0.00–0.07)
Basophils Absolute: 0 10*3/uL (ref 0.0–0.1)
Basophils Relative: 0 %
Eosinophils Absolute: 0 10*3/uL (ref 0.0–0.5)
Eosinophils Relative: 0 %
Immature Granulocytes: 0 %
Lymphocytes Relative: 16 %
Lymphs Abs: 1.6 10*3/uL (ref 0.7–4.0)
Monocytes Absolute: 1.2 10*3/uL — ABNORMAL HIGH (ref 0.1–1.0)
Monocytes Relative: 12 %
Neutro Abs: 7.6 10*3/uL (ref 1.7–7.7)
Neutrophils Relative %: 72 %

## 2023-04-08 LAB — COMPREHENSIVE METABOLIC PANEL
ALT: 26 U/L (ref 0–44)
AST: 49 U/L — ABNORMAL HIGH (ref 15–41)
Albumin: 3.9 g/dL (ref 3.5–5.0)
Alkaline Phosphatase: 44 U/L (ref 38–126)
Anion gap: 10 (ref 5–15)
BUN: 23 mg/dL (ref 8–23)
CO2: 25 mmol/L (ref 22–32)
Calcium: 9.1 mg/dL (ref 8.9–10.3)
Chloride: 98 mmol/L (ref 98–111)
Creatinine, Ser: 1.37 mg/dL — ABNORMAL HIGH (ref 0.44–1.00)
GFR, Estimated: 37 mL/min — ABNORMAL LOW (ref >60)
Glucose, Bld: 215 mg/dL — ABNORMAL HIGH (ref 70–99)
Potassium: 3.3 mmol/L — ABNORMAL LOW (ref 3.5–5.1)
Sodium: 133 mmol/L — ABNORMAL LOW (ref 135–145)
Total Bilirubin: 0.7 mg/dL (ref 0.3–1.2)
Total Protein: 7.1 g/dL (ref 6.5–8.1)

## 2023-04-08 LAB — RAPID URINE DRUG SCREEN, HOSP PERFORMED
Amphetamines: NOT DETECTED
Barbiturates: NOT DETECTED
Benzodiazepines: NOT DETECTED
Cocaine: NOT DETECTED
Opiates: NOT DETECTED
Tetrahydrocannabinol: NOT DETECTED

## 2023-04-08 LAB — PROTIME-INR
INR: 1 (ref 0.8–1.2)
Prothrombin Time: 13.7 seconds (ref 11.4–15.2)

## 2023-04-08 LAB — CBC
HCT: 37.5 % (ref 36.0–46.0)
Hemoglobin: 11.8 g/dL — ABNORMAL LOW (ref 12.0–15.0)
MCH: 28 pg (ref 26.0–34.0)
MCHC: 31.5 g/dL (ref 30.0–36.0)
MCV: 89.1 fL (ref 80.0–100.0)
Platelets: 263 10*3/uL (ref 150–400)
RBC: 4.21 MIL/uL (ref 3.87–5.11)
RDW: 12.8 % (ref 11.5–15.5)
WBC: 10.5 10*3/uL (ref 4.0–10.5)
nRBC: 0 % (ref 0.0–0.2)

## 2023-04-08 LAB — ETHANOL: Alcohol, Ethyl (B): <10 mg/dL (ref <10)

## 2023-04-08 LAB — APTT: aPTT: 30 seconds (ref 24–36)

## 2023-04-08 MED ORDER — IOHEXOL 350 MG/ML SOLN
100.0000 mL | Freq: Once | INTRAVENOUS | Status: AC | PRN
  Administered 2023-04-08: 100 mL via INTRAVENOUS

## 2023-04-08 NOTE — Progress Notes (Signed)
1920 elert for patient headed to CT 1921 Page to [facility] neuro 1922 patient in ct at this time 17 Dr. Wilford Corner on screen 1933 Dr. Wilford Corner advised not a tnk candidate at this time and he will contact us if anything else is needed.

## 2023-04-08 NOTE — Consult Note (Addendum)
[facility] Telemedicine Consult  Requesting Provider: Dr. Estell Harpin Consult Participants: Dr. Marthe Patch, Telespecialist RN Tresa Endo   bedside RN Claris Gower Location of the provider: Home Location of the patient: [name]  This consult was provided via telemedicine with 2-way video and audio communication. The patient/family was informed that care would be provided in this way and agreed to receive care in this manner.   CC: Fall, slurred speech, aphasia  HPI: [name] is a 89 y.o. female past medical history of diabetes, hypertension, hyperlipidemia, seen by neurology outpatient for progressive memory loss and multiple falls in 2019-unclear if there are any other deficits but Mini-Mental status exam in November 2018 was 20 out of 25 corrected for a level of education-second grade functional level, presented for evaluation after sustaining a fall last night and found this morning at 11 AM laying in the bathroom by the family.  Then she has been having intermittent expressive aphasia ever since 5:45 PM.  At this point, there are no focal neurological deficits. Detailed examination below Denies any tingling numbness weakness visual symptoms chest pain shortness of breath nausea vomiting.   Past Medical History:  Diagnosis Date   Anemia    Diabetes mellitus without complication (HCC)    Difficulty sleeping    GERD (gastroesophageal reflux disease)    Hyperlipidemia    Hypertension    Sciatic pain    Shortness of breath dyspnea    with exertion   Spinal stenosis     No current facility-administered medications for this encounter.  Current Outpatient Medications:    aspirin 325 MG tablet, Take 1 tablet (325 mg total) by mouth daily. Resume 4 days post-op, Disp: , Rfl:    b complex vitamins tablet, Take 1 tablet by mouth daily., Disp: , Rfl:    Calcium Citrate-Vitamin D (CALCIUM + D PO), Take 1 tablet by mouth daily., Disp: , Rfl:    clobetasol (TEMOVATE) 0.05 % external  solution, Apply topically., Disp: , Rfl:    Cyanocobalamin (B-12) 1000 MCG TBCR, Take 1 tablet by mouth daily., Disp: , Rfl:    docusate sodium (COLACE) 100 MG capsule, Take 1 capsule (100 mg total) by mouth 2 (two) times daily as needed for mild constipation., Disp: 20 capsule, Rfl: 1   esomeprazole (NEXIUM) 20 MG capsule, Take 20 mg by mouth daily at 12 noon., Disp: , Rfl:    gabapentin (NEURONTIN) 100 MG capsule, Take 100 mg by mouth 3 (three) times daily. , Disp: , Rfl: 1   glimepiride (AMARYL) 2 MG tablet, Take 6 mg by mouth., Disp: , Rfl:    glucose blood (ONE TOUCH ULTRA TEST) test strip, TEST 2 TIMES DAILY, Disp: , Rfl:    hydrochlorothiazide (HYDRODIURIL) 25 MG tablet, Take 25 mg by mouth daily., Disp: , Rfl: 5   lisinopril (PRINIVIL,ZESTRIL) 10 MG tablet, Take 10 mg by mouth daily., Disp: , Rfl: 2   magnesium oxide (MAG-OX) 400 MG tablet, Take 400 mg by mouth daily., Disp: , Rfl:    metFORMIN (GLUCOPHAGE) 1000 MG tablet, Take 1,000 mg by mouth daily., Disp: , Rfl: 8   Multiple Vitamin (MULTIVITAMIN WITH MINERALS) TABS tablet, Take 1 tablet by mouth daily., Disp: , Rfl:    sennosides-docusate sodium (SENOKOT-S) 8.6-50 MG tablet, Take 2 tablets by mouth 2 (two) times daily., Disp: , Rfl:    simvastatin (ZOCOR) 20 MG tablet, Take 20 mg by mouth daily., Disp: , Rfl: 1   Vitamins/Minerals TABS, Take by mouth., Disp: , Rfl:  LKW: Unclear-has had intermittent symptoms with a fall at 11 PM yesterday and some expressive aphasia that lasted a few minutes at 5:45 PM-at this time no symptoms.  Clock will reset IV thrombolysis given?: no, NIH 0 IR Thrombectomy? No, no ELVO mRS unclear Time of teleneurologist evaluation: 1922  Exam: There were no vitals filed for this visit.  General: Awake alert no distress Neurologic exam Awake alert oriented x 3. No dysarthria No aphasia-read all the objects and named pictures correctly. Cranial nerves II to XII intact Motor examination with no  drift Sensation intact Coordination examination with no dysmetria NIH stroke scale-0 Imaging Reviewed: CT head-no acute changes.  Aspects 10.  Labs reviewed in epic and pertinent values follow: Pending at this time  Assessment: 89 year old with above past medical history presenting for evaluation of slurred speech and aphasia like symptoms after having had a fall last night and laying on the floor of the bathroom for a while.  Last known well sometime either at 11 PM when she had the fall or right before when she had the symptoms at around 5:40 PM today-unclear. In either case, her symptoms have completely resolved.  NIH stroke scale is 0. Not a candidate for IV thrombolysis or endovascular thrombectomy. I will get a CT angio head and neck with perfusion study to make sure that there is no extracranial or intracranial vessel occlusion or hanging thrombus causing fluctuating symptoms.  Recommendations:  CT angiography head and neck stat-will follow Check UA, chest x-ray. Check basic labs-still pending. Primary plan discussed with Dr. Estell Harpin.  ADDENDUM Stat CTA and perfusion negative for eLVO. Multifocal stenoses seen. Could be secondary to an underlying infection etc. or could have been a left hemispheric TIA. Given her age and risk factors, I would recommend transfer to [facility] for a TIA risk factor workup to include MR brain w/o, 2D echocardiogram, A1c, lipid panel, telemetry, frequent neurochecks. I will continue her home aspirin for now.  Final recommendations based on clinical course and further workup completion. No MRI or neurology f/u at [facility] over the weekend. Admit to hospitalist at [facility]. Plan was relayed to Dr. Estell Harpin.  -- Milon Dikes, MD Neurologist [facility] Pager: [phone]

## 2023-04-08 NOTE — ED Notes (Signed)
Did not want anything to drink; unable to perform SSS.

## 2023-04-08 NOTE — ED Notes (Signed)
Pt being transferred to ED stretcher, Dr Estell Harpin present getting report from EMS and assessing pt- will go to CT when cleared with this nurse

## 2023-04-08 NOTE — ED Notes (Signed)
CODE STROKE PAGED @ 1900 in field upon encode from EMS

## 2023-04-08 NOTE — ED Notes (Signed)
CT perfusion delayed d/t obtaining 18 g IV access, pt is hard stick, CT tech attempt as well as this nurse. CT perfusion done at this time, nurse to transport pt to room 12 and place pt on monitor to obtain vitals.

## 2023-04-08 NOTE — ED Provider Notes (Signed)
[place] EMERGENCY DEPARTMENT AT [facility] Provider Note   CSN: [id number] Arrival date & time: 04/08/23  1914  An emergency department physician performed an initial assessment on this suspected stroke patient at 70.  History {Add pertinent medical, surgical, social history, OB history to HPI:1} Chief Complaint  Patient presents with   Dysphagia    Intermittent slurred speech and dysphagia-code stroke called by Dr Holly Bodily is a 89 y.o. female.  Patient with a history of hypertension and hyperlipidemia.  Around 545 today she had some expressive aphasia.  This has resolved.   Weakness      Home Medications Prior to Admission medications   Medication Sig Start Date End Date Taking? Authorizing Provider  aspirin 325 MG tablet Take 1 tablet (325 mg total) by mouth daily. Resume 4 days post-op 05/15/15   Dorothy Spark, PA-C  b complex vitamins tablet Take 1 tablet by mouth daily.    [provider]  Calcium Citrate-Vitamin D (CALCIUM + D PO) Take 1 tablet by mouth daily.    [provider]  clobetasol (TEMOVATE) 0.05 % external solution Apply topically. 05/27/16   [provider]  Cyanocobalamin (B-12) 1000 MCG TBCR Take 1 tablet by mouth daily.    [provider]  docusate sodium (COLACE) 100 MG capsule Take 1 capsule (100 mg total) by mouth 2 (two) times daily as needed for mild constipation. 05/14/15   Jene Every, MD  esomeprazole (NEXIUM) 20 MG capsule Take 20 mg by mouth daily at 12 noon.    [provider]  gabapentin (NEURONTIN) 100 MG capsule Take 100 mg by mouth 3 (three) times daily.  04/14/15   [provider]  glimepiride (AMARYL) 2 MG tablet Take 6 mg by mouth. 12/03/16   [provider]  glucose blood (ONE TOUCH ULTRA TEST) test strip TEST 2 TIMES DAILY 07/16/16   [provider]  hydrochlorothiazide (HYDRODIURIL) 25 MG tablet Take 25 mg by mouth daily. 04/16/15    [provider]  lisinopril (PRINIVIL,ZESTRIL) 10 MG tablet Take 10 mg by mouth daily. 02/24/15   [provider]  magnesium oxide (MAG-OX) 400 MG tablet Take 400 mg by mouth daily.    [provider]  metFORMIN (GLUCOPHAGE) 1000 MG tablet Take 1,000 mg by mouth daily. 04/16/15   [provider]  Multiple Vitamin (MULTIVITAMIN WITH MINERALS) TABS tablet Take 1 tablet by mouth daily.    [provider]  sennosides-docusate sodium (SENOKOT-S) 8.6-50 MG tablet Take 2 tablets by mouth 2 (two) times daily.    [provider]  simvastatin (ZOCOR) 20 MG tablet Take 20 mg by mouth daily. 02/06/15   [provider]  Vitamins/Minerals TABS Take by mouth.    [provider]      Allergies    Patient has no known allergies.    Review of Systems   Review of Systems  Neurological:  Positive for weakness.    Physical Exam Updated Vital Signs BP (!) 150/87   Pulse 77   Temp 99 F (37.2 C) (Oral)   Resp 19   Ht 5\' 4"  (1.626 m)   Wt 94.3 kg   SpO2 94%   BMI 35.68 kg/m  Physical Exam  ED Results / Procedures / Treatments   Labs (all labs ordered are listed, but only abnormal results are displayed) Labs Reviewed  CBC - Abnormal; Notable for the following components:      Result Value  Hemoglobin 11.8 (*)    All other components within normal limits  COMPREHENSIVE METABOLIC PANEL - Abnormal; Notable for the following components:   Sodium 133 (*)    Potassium 3.3 (*)    Glucose, Bld 215 (*)    Creatinine, Ser 1.37 (*)    AST 49 (*)    GFR, Estimated 37 (*)    All other components within normal limits  DIFFERENTIAL - Abnormal; Notable for the following components:   Monocytes Absolute 1.2 (*)    All other components within normal limits  ETHANOL  PROTIME-INR  APTT  RAPID URINE DRUG SCREEN, HOSP PERFORMED  URINALYSIS, ROUTINE W REFLEX MICROSCOPIC    EKG None  Radiology CT ANGIO HEAD NECK W WO CM W PERF (CODE  STROKE)  Result Date: 04/08/2023 CLINICAL DATA:  Aphasia EXAM: CT ANGIOGRAPHY HEAD AND NECK CT PERFUSION BRAIN TECHNIQUE: Multidetector CT imaging of the head and neck was performed using the standard protocol during bolus administration of intravenous contrast. Multiplanar CT image reconstructions and MIPs were obtained to evaluate the vascular anatomy. Carotid stenosis measurements (when applicable) are obtained utilizing NASCET criteria, using the distal internal carotid diameter as the denominator. Multiphase CT imaging of the brain was performed following IV bolus contrast injection. Subsequent parametric perfusion maps were calculated using RAPID software. RADIATION DOSE REDUCTION: This exam was performed according to the departmental dose-optimization program which includes automated exposure control, adjustment of the mA and/or kV according to patient size and/or use of iterative reconstruction technique. CONTRAST:  OMNIPAQUE IOHEXOL 350 MG/ML SOLN COMPARISON:  CT 04/08/2023 FINDINGS: CTA NECK FINDINGS Aortic arch: Calcific aortic atherosclerosis. Normal branching pattern. Right carotid system: Mild atherosclerotic calcification of the carotid bifurcation and extending into the proximal external and internal carotid arteries. No hemodynamically significant stenosis. Left carotid system: Calcific atherosclerosis at the carotid bifurcation and standing into the proximal ICA. No hemodynamically significant stenosis. Vertebral arteries: Right dominant.  No stenosis or occlusion. Skeleton: Negative Other neck: Negative Upper chest: Negative Review of the MIP images confirms the above findings CTA HEAD FINDINGS Anterior circulation: --Intracranial internal carotid arteries: Atherosclerotic calcification with mild narrowing of the left ophthalmic segment. --Anterior cerebral arteries (ACA): Multifocal severe stenosis/short segment occlusion of the right anterior cerebral artery A2 segment. Mild multifocal  narrowing of the left ACA. Both A1 segments are normal. --Middle cerebral arteries (MCA): Multifocal severe stenosis of the left MCA M2 branches, worst at the inferior division. Multifocal mild stenosis the right MCA. Posterior circulation: --Vertebral arteries: Left vertebral artery terminates in PICA. Normal right. --Inferior cerebellar arteries: Normal. --Basilar artery: Normal. --Superior cerebellar arteries: Normal. --Posterior cerebral arteries: No proximal occlusion or flow-limiting stenosis. Venous sinuses: As permitted by contrast timing, patent. Anatomic variants: None Review of the MIP images confirms the above findings CT Brain Perfusion Findings: ASPECTS: 10 CBF (<30%) Volume: 0mL Perfusion (Tmax>6.0s) volume: 0mL Mismatch Volume: 0mL Infarction Location:None IMPRESSION: 1. No emergent large vessel occlusion. 2. Multifocal severe stenosis/short segment occlusion of the right anterior cerebral artery A2 segment. 3. Multifocal severe stenosis of the left MCA M2 branches, worst at the inferior division. 4. Bilateral carotid bifurcation atherosclerosis without hemodynamically significant stenosis. 5. Normal CT perfusion scan Aortic Atherosclerosis (ICD10-I70.0). Electronically Signed   By: Deatra Robinson M.D.   On: 04/08/2023 20:12   CT HEAD CODE STROKE WO CONTRAST  Result Date: 04/08/2023 CLINICAL DATA:  Code stroke. Neuro deficit, acute, stroke suspected. Fall last night. Hit head on toilet and remained on floor until this morning. Began having  aphasia at 5:45 p.m. today. EXAM: CT HEAD WITHOUT CONTRAST TECHNIQUE: Contiguous axial images were obtained from the base of the skull through the vertex without intravenous contrast. RADIATION DOSE REDUCTION: This exam was performed according to the departmental dose-optimization program which includes automated exposure control, adjustment of the mA and/or kV according to patient size and/or use of iterative reconstruction technique. COMPARISON:  CT head  without contrast 01/28/2017 FINDINGS: Brain: Progressive mild atrophy and moderate diffuse white matter disease is present. A lacunar infarct involving the left thalamus is new since the prior study but appears remote. Associated volume loss is present. The deep brain nuclei are otherwise within normal limits. No acute or focal cortical infarct is present. No acute hemorrhage is present. The brainstem and cerebellum are within normal limits. Midline structures are within normal limits. Vascular: Atherosclerotic calcifications are present within the cavernous internal carotid arteries bilaterally. No hyperdense vessel is present. Skull: Calvarium is intact. No focal lytic or blastic lesions are present. No significant extracranial soft tissue lesion is present. Sinuses/Orbits: Bilateral lens replacements are noted. Globes and orbits are otherwise unremarkable. The paranasal sinuses and mastoid air cells are clear. ASPECTS [facility] Stroke Program Early CT Score) - Ganglionic level infarction (caudate, lentiform nuclei, internal capsule, insula, M1-M3 cortex): 7/7 - Supraganglionic infarction (M4-M6 cortex): 3/3 Total score (0-10 with 10 being normal): 10/10 IMPRESSION: 1. No acute intracranial abnormality. 2. Progressive mild atrophy and moderate diffuse white matter disease. This likely reflects the sequela of chronic microvascular ischemia. 3. Lacunar infarct involving the left thalamus is new since the prior study but appears remote. 4. Aspects is 10/10. These results were called by telephone at the time of interpretation on 04/08/2023 at 7:36 pm to provider [facility] Vikkie Goeden , who verbally acknowledged these results. Electronically Signed   By: Marin Roberts M.D.   On: 04/08/2023 19:37    Procedures Procedures  {Document cardiac monitor, telemetry assessment procedure when appropriate:1}  Medications Ordered in ED Medications  iohexol (OMNIPAQUE) 350 MG/ML injection 100 mL (100 mLs Intravenous Contrast  Given 04/08/23 1935)    ED Course/ Medical Decision Making/ A&P   {   Click here for ABCD2, HEART and other calculatorsREFRESH Note before signing :1}                              Medical Decision Making Amount and/or Complexity of Data Reviewed Labs: ordered. Radiology: ordered.  Risk Decision regarding hospitalization.   Patient with a TIA.  She is admitted to medicine over [facility] with neurology consulting  {Document critical care time when appropriate:1} {Document review of labs and clinical decision tools ie heart score, Chads2Vasc2 etc:1}  {Document your independent review of radiology images, and any outside records:1} {Document your discussion with family members, caretakers, and with consultants:1} {Document social determinants of health affecting pt's care:1} {Document your decision making why or why not admission, treatments were needed:1} Final Clinical Impression(s) / ED Diagnoses Final diagnoses:  TIA (transient ischemic attack)    Rx / DC Orders ED Discharge Orders     None

## 2023-04-08 NOTE — ED Notes (Signed)
Family in room with patient

## 2023-04-08 NOTE — H&P (Signed)
History and Physical    PatientMarland Kitchen [name] YQM:[id number] DOB: 1933/09/04 DOA: 04/08/2023 DOS: the patient was seen and examined on 04/09/2023 PCP: April Manson, NP  Patient coming from: Home  Chief Complaint:  Chief Complaint  Patient presents with   Dysphagia    Intermittent slurred speech and dysphagia-code stroke called by Dr Estell Harpin   HPI: [name] is a 89 y.o. female with medical history significant of hypertension, hyperlipidemia, GERD, type 2 diabetes mellitus who presented to the emergency department from home via EMS due to intermittent, expressive dysphagia and slow speech which started at 5:46 PM.  Last known well was 5:45 PM today.  She complained of sustaining a fall in the bathroom last night and hit her head, she was found this morning by son around 48 AM and she was at baseline..  Symptoms did not start until this evening.  She lives alone at baseline and family checks on her regularly.  Patient was taken to the ED for further evaluation and management.  ED Course:  In the emergency department, BP was 149/39, other vital signs were within normal range.  Workup in the ED showed normocytic anemia.  BMP was normal except for sodium of 133, potassium 3.3, blood glucose 215, creatinine 1.37, most recent creatinine for comparison was 7 years ago and it was 0.7-1.0), AST 49, urinalysis was unimpressive for UTI, urine drug screen was normal. CT angiography of head and neck showed no emergent LVO CT head without contrast showed no acute intracranial abnormality Teleneurologist (Dr. Wilford Corner) was consulted and recommended further stroke workup and it was recommended for patient to be admitted to [facility] due to no neurology follow-up at [facility] over the weekend. Hospitalist was asked to admit patient for further evaluation and management.  Review of Systems: Review of systems as noted in the HPI. All other systems reviewed and are negative.   Past Medical History:  Diagnosis Date    Anemia    Diabetes mellitus without complication (HCC)    Difficulty sleeping    GERD (gastroesophageal reflux disease)    Hyperlipidemia    Hypertension    Sciatic pain    Shortness of breath dyspnea    with exertion   Spinal stenosis    Past Surgical History:  Procedure Laterality Date   ABDOMINAL HYSTERECTOMY  1980's   LUMBAR LAMINECTOMY/DECOMPRESSION MICRODISCECTOMY N/A 05/14/2015   Procedure: MICRO LUMBAR DECOMPRESSION L4-L5 POSSIBLE L3-L4 ( 2 LEVELS);  Surgeon: Jene Every, MD;  Location: [facility];  Service: Orthopedics;  Laterality: N/A;    Social History:  reports that she has never smoked. She has never used smokeless tobacco. She reports that she does not drink alcohol and does not use drugs.   No Known Allergies  Family History  Problem Relation Age of Onset   Hypertension Mother      Prior to Admission medications   Medication Sig Start Date End Date Taking? Authorizing Provider  aspirin 325 MG tablet Take 1 tablet (325 mg total) by mouth daily. Resume 4 days post-op 05/15/15   Dorothy Spark, PA-C  b complex vitamins tablet Take 1 tablet by mouth daily.    [provider]  Calcium Citrate-Vitamin D (CALCIUM + D PO) Take 1 tablet by mouth daily.    [provider]  clobetasol (TEMOVATE) 0.05 % external solution Apply topically. 05/27/16   [provider]  Cyanocobalamin (B-12) 1000 MCG TBCR Take 1 tablet by mouth daily.    [provider]  docusate sodium (COLACE) 100 MG capsule Take 1 capsule (100 mg total) by mouth 2 (two) times daily as needed for mild constipation. 05/14/15   Jene Every, MD  esomeprazole (NEXIUM) 20 MG capsule Take 20 mg by mouth daily at 12 noon.    [provider]  gabapentin (NEURONTIN) 100 MG capsule Take 100 mg by mouth 3 (three) times daily.  04/14/15   [provider]  glimepiride (AMARYL) 2 MG tablet Take 6 mg by mouth. 12/03/16   [provider]  glucose blood (ONE TOUCH  ULTRA TEST) test strip TEST 2 TIMES DAILY 07/16/16   [provider]  hydrochlorothiazide (HYDRODIURIL) 25 MG tablet Take 25 mg by mouth daily. 04/16/15   [provider]  lisinopril (PRINIVIL,ZESTRIL) 10 MG tablet Take 10 mg by mouth daily. 02/24/15   [provider]  magnesium oxide (MAG-OX) 400 MG tablet Take 400 mg by mouth daily.    [provider]  metFORMIN (GLUCOPHAGE) 1000 MG tablet Take 1,000 mg by mouth daily. 04/16/15   [provider]  Multiple Vitamin (MULTIVITAMIN WITH MINERALS) TABS tablet Take 1 tablet by mouth daily.    [provider]  sennosides-docusate sodium (SENOKOT-S) 8.6-50 MG tablet Take 2 tablets by mouth 2 (two) times daily.    [provider]  simvastatin (ZOCOR) 20 MG tablet Take 20 mg by mouth daily. 02/06/15   [provider]  Vitamins/Minerals TABS Take by mouth.    [provider]    Physical Exam: BP (!) 161/53 (BP Location: Right Arm)   Pulse 82   Temp 98.8 F (37.1 C) (Oral)   Resp 14   Ht 5\' 4"  (1.626 m)   Wt 94.3 kg   SpO2 97%   BMI 35.68 kg/m   General: 89 y.o. year-old female well developed well nourished in no acute distress.  Alert and oriented x3. HEENT: NCAT, EOMI Neck: Supple, trachea medial Cardiovascular: Regular rate and rhythm with no rubs or gallops.  No thyromegaly or JVD noted.  No lower extremity edema. 2/4 pulses in all 4 extremities. Respiratory: Clear to auscultation with no wheezes or rales. Good inspiratory effort. Abdomen: Soft, nontender nondistended with normal bowel sounds x4 quadrants. Muskuloskeletal: No cyanosis, clubbing or edema noted bilaterally Neuro: CN II-XII intact, strength 5/5 x 4, sensation, reflexes intact Skin: No ulcerative lesions noted or rashes Psychiatry: Judgement and insight appear normal. Mood is appropriate for condition and setting          Labs on Admission:  Basic Metabolic Panel: Recent Labs  Lab 04/08/23 1932   NA 133*  K 3.3*  CL 98  CO2 25  GLUCOSE 215*  BUN 23  CREATININE 1.37*  CALCIUM 9.1   Liver Function Tests: Recent Labs  Lab 04/08/23 1932  AST 49*  ALT 26  ALKPHOS 44  BILITOT 0.7  PROT 7.1  ALBUMIN 3.9   No results for input(s): "LIPASE", "AMYLASE" in the last 168 hours. No results for input(s): "AMMONIA" in the last 168 hours. CBC: Recent Labs  Lab 04/08/23 1932  WBC 10.5  NEUTROABS 7.6  HGB 11.8*  HCT 37.5  MCV 89.1  PLT 263   Cardiac Enzymes: No results for input(s): "CKTOTAL", "CKMB", "CKMBINDEX", "TROPONINI" in the last 168 hours.  BNP (last 3 results) No results for input(s): "BNP" in the last 8760 hours.  ProBNP (last 3 results) No results for input(s): "PROBNP" in the last 8760 hours.  CBG: No results for input(s): "GLUCAP" in the last 168  hours.  Radiological Exams on Admission: CT ANGIO HEAD NECK W WO CM W PERF (CODE STROKE)  Result Date: 04/08/2023 CLINICAL DATA:  Aphasia EXAM: CT ANGIOGRAPHY HEAD AND NECK CT PERFUSION BRAIN TECHNIQUE: Multidetector CT imaging of the head and neck was performed using the standard protocol during bolus administration of intravenous contrast. Multiplanar CT image reconstructions and MIPs were obtained to evaluate the vascular anatomy. Carotid stenosis measurements (when applicable) are obtained utilizing NASCET criteria, using the distal internal carotid diameter as the denominator. Multiphase CT imaging of the brain was performed following IV bolus contrast injection. Subsequent parametric perfusion maps were calculated using RAPID software. RADIATION DOSE REDUCTION: This exam was performed according to the departmental dose-optimization program which includes automated exposure control, adjustment of the mA and/or kV according to patient size and/or use of iterative reconstruction technique. CONTRAST:  OMNIPAQUE IOHEXOL 350 MG/ML SOLN COMPARISON:  CT 04/08/2023 FINDINGS: CTA NECK FINDINGS Aortic arch: Calcific  aortic atherosclerosis. Normal branching pattern. Right carotid system: Mild atherosclerotic calcification of the carotid bifurcation and extending into the proximal external and internal carotid arteries. No hemodynamically significant stenosis. Left carotid system: Calcific atherosclerosis at the carotid bifurcation and standing into the proximal ICA. No hemodynamically significant stenosis. Vertebral arteries: Right dominant.  No stenosis or occlusion. Skeleton: Negative Other neck: Negative Upper chest: Negative Review of the MIP images confirms the above findings CTA HEAD FINDINGS Anterior circulation: --Intracranial internal carotid arteries: Atherosclerotic calcification with mild narrowing of the left ophthalmic segment. --Anterior cerebral arteries (ACA): Multifocal severe stenosis/short segment occlusion of the right anterior cerebral artery A2 segment. Mild multifocal narrowing of the left ACA. Both A1 segments are normal. --Middle cerebral arteries (MCA): Multifocal severe stenosis of the left MCA M2 branches, worst at the inferior division. Multifocal mild stenosis the right MCA. Posterior circulation: --Vertebral arteries: Left vertebral artery terminates in PICA. Normal right. --Inferior cerebellar arteries: Normal. --Basilar artery: Normal. --Superior cerebellar arteries: Normal. --Posterior cerebral arteries: No proximal occlusion or flow-limiting stenosis. Venous sinuses: As permitted by contrast timing, patent. Anatomic variants: None Review of the MIP images confirms the above findings CT Brain Perfusion Findings: ASPECTS: 10 CBF (<30%) Volume: 0mL Perfusion (Tmax>6.0s) volume: 0mL Mismatch Volume: 0mL Infarction Location:None IMPRESSION: 1. No emergent large vessel occlusion. 2. Multifocal severe stenosis/short segment occlusion of the right anterior cerebral artery A2 segment. 3. Multifocal severe stenosis of the left MCA M2 branches, worst at the inferior division. 4. Bilateral carotid  bifurcation atherosclerosis without hemodynamically significant stenosis. 5. Normal CT perfusion scan Aortic Atherosclerosis (ICD10-I70.0). Electronically Signed   By: Deatra Robinson M.D.   On: 04/08/2023 20:12   CT HEAD CODE STROKE WO CONTRAST  Result Date: 04/08/2023 CLINICAL DATA:  Code stroke. Neuro deficit, acute, stroke suspected. Fall last night. Hit head on toilet and remained on floor until this morning. Began having aphasia at 5:45 p.m. today. EXAM: CT HEAD WITHOUT CONTRAST TECHNIQUE: Contiguous axial images were obtained from the base of the skull through the vertex without intravenous contrast. RADIATION DOSE REDUCTION: This exam was performed according to the departmental dose-optimization program which includes automated exposure control, adjustment of the mA and/or kV according to patient size and/or use of iterative reconstruction technique. COMPARISON:  CT head without contrast 01/28/2017 FINDINGS: Brain: Progressive mild atrophy and moderate diffuse white matter disease is present. A lacunar infarct involving the left thalamus is new since the prior study but appears remote. Associated volume loss is present. The deep brain nuclei are otherwise within normal limits. No acute  or focal cortical infarct is present. No acute hemorrhage is present. The brainstem and cerebellum are within normal limits. Midline structures are within normal limits. Vascular: Atherosclerotic calcifications are present within the cavernous internal carotid arteries bilaterally. No hyperdense vessel is present. Skull: Calvarium is intact. No focal lytic or blastic lesions are present. No significant extracranial soft tissue lesion is present. Sinuses/Orbits: Bilateral lens replacements are noted. Globes and orbits are otherwise unremarkable. The paranasal sinuses and mastoid air cells are clear. ASPECTS [facility] Stroke Program Early CT Score) - Ganglionic level infarction (caudate, lentiform nuclei, internal capsule,  insula, M1-M3 cortex): 7/7 - Supraganglionic infarction (M4-M6 cortex): 3/3 Total score (0-10 with 10 being normal): 10/10 IMPRESSION: 1. No acute intracranial abnormality. 2. Progressive mild atrophy and moderate diffuse white matter disease. This likely reflects the sequela of chronic microvascular ischemia. 3. Lacunar infarct involving the left thalamus is new since the prior study but appears remote. 4. Aspects is 10/10. These results were called by telephone at the time of interpretation on 04/08/2023 at 7:36 pm to provider [facility] ZAMMIT , who verbally acknowledged these results. Electronically Signed   By: Marin Roberts M.D.   On: 04/08/2023 19:37    EKG: I independently viewed the EKG done and my findings are as followed: Normal sinus rhythm at a rate of 91 bpm with multiple PVCs and LAE  Assessment/Plan Present on Admission:  TIA (transient ischemic attack)  Principal Problem:   TIA (transient ischemic attack) Active Problems:   Hypokalemia   AKI (acute kidney injury) (HCC)   Essential hypertension   Mixed hyperlipidemia   GERD (gastroesophageal reflux disease)   Obesity (BMI 30-39.9)   Type 2 diabetes mellitus with hyperglycemia (HCC)  Transient ischemic attack Patient will be admitted to telemetry unit  CT angiography of head and neck showed no emergent LVO CT head without contrast showed no acute intracranial abnormality Echocardiogram in the morning MRI of brain without contrast in the morning Continue aspirin and statin Continue fall precautions and neuro checks Lipid panel and hemoglobin A1c will be checked Continue PT/SLP/OT eval and treat Bedside swallow eval by nursing prior to diet Teleneurology consult appreciated Continue neurology consult and we shall await further recommendations  Hypokalemia K+ 3.3, this will be replenished  Acute kidney injury Creatinine 1.37, most recent creatinine for comparison was 7 years ago and it was 0.7-1.0) Continue gentle  hydration Renally adjust medications, avoid nephrotoxic agents/dehydration/hypotension  Type 2 diabetes mellitus with hyperglycemia Hemoglobin A1c was 6.4 in April 2024 per medical record in Care Everywhere Continue ISS and hypoglycemia protocol Glimepiride and metformin will be held at this time  Essential hypertension  Antihypertensives PRN if Blood pressure is greater than 220/120 or there is a concern for End organ damage/contraindications for permissive HTN. If blood pressure is greater than 220/120 give labetalol PO or IV or Vasotec IV with a goal of 15% reduction in BP during the first 24 hours.  Mixed hyperlipidemia Continue Zocor  GERD Continue Protonix  Obesity (BMI 35.68) Diet and lifestyle modification  DVT prophylaxis: SCDs (consider starting patient on chemoprophylaxis status post negative MRI of brain)   Advance Care Planning: CODE STATUS: Full code  Consults: Neurology (by [facility] EDP)  Family Communication: Granddaughter at bedside (all questions answered to satisfaction)  Severity of Illness: The appropriate patient status for this patient is OBSERVATION. Observation status is judged to be reasonable and necessary in order to provide the required intensity of service to ensure the patient's safety. The patient's presenting symptoms,  physical exam findings, and initial radiographic and laboratory data in the context of their medical condition is felt to place them at decreased risk for further clinical deterioration. Furthermore, it is anticipated that the patient will be medically stable for discharge from the hospital within 2 midnights of admission.   Author: Frankey Shown, DO 04/09/2023 2:15 AM  For on call review [email or web address].

## 2023-04-08 NOTE — ED Triage Notes (Signed)
Pt bib [facility] from home with c/o intermittent, expressive dysphagia and slurred speech that started at 5:46 pm, LKW was 5:45 pm today. Pt lives at home alone, reports falling last night in bathroom and hitting head, pt found this morning by son around 11 am, pt at baseline until sx started later in afternoon as noted above. Upon pt arrival, pt alert and oriented x 4 with no deficits noted by this nurse.

## 2023-04-09 ENCOUNTER — Observation Stay (HOSPITAL_COMMUNITY): Payer: Medicare Other

## 2023-04-09 ENCOUNTER — Observation Stay (HOSPITAL_BASED_OUTPATIENT_CLINIC_OR_DEPARTMENT_OTHER): Payer: Medicare Other

## 2023-04-09 ENCOUNTER — Other Ambulatory Visit (HOSPITAL_COMMUNITY): Payer: Medicare Other

## 2023-04-09 DIAGNOSIS — G459 Transient cerebral ischemic attack, unspecified: Secondary | ICD-10-CM | POA: Diagnosis not present

## 2023-04-09 DIAGNOSIS — E1165 Type 2 diabetes mellitus with hyperglycemia: Secondary | ICD-10-CM | POA: Insufficient documentation

## 2023-04-09 DIAGNOSIS — E869 Volume depletion, unspecified: Secondary | ICD-10-CM | POA: Diagnosis not present

## 2023-04-09 DIAGNOSIS — R569 Unspecified convulsions: Secondary | ICD-10-CM

## 2023-04-09 DIAGNOSIS — E669 Obesity, unspecified: Secondary | ICD-10-CM | POA: Insufficient documentation

## 2023-04-09 DIAGNOSIS — E782 Mixed hyperlipidemia: Secondary | ICD-10-CM | POA: Insufficient documentation

## 2023-04-09 DIAGNOSIS — R1310 Dysphagia, unspecified: Secondary | ICD-10-CM | POA: Diagnosis present

## 2023-04-09 DIAGNOSIS — N179 Acute kidney failure, unspecified: Secondary | ICD-10-CM | POA: Insufficient documentation

## 2023-04-09 DIAGNOSIS — Z7984 Long term (current) use of oral hypoglycemic drugs: Secondary | ICD-10-CM | POA: Diagnosis not present

## 2023-04-09 DIAGNOSIS — E876 Hypokalemia: Secondary | ICD-10-CM | POA: Diagnosis not present

## 2023-04-09 DIAGNOSIS — Z79899 Other long term (current) drug therapy: Secondary | ICD-10-CM | POA: Diagnosis not present

## 2023-04-09 DIAGNOSIS — Z7982 Long term (current) use of aspirin: Secondary | ICD-10-CM | POA: Diagnosis not present

## 2023-04-09 DIAGNOSIS — I10 Essential (primary) hypertension: Secondary | ICD-10-CM | POA: Insufficient documentation

## 2023-04-09 DIAGNOSIS — K219 Gastro-esophageal reflux disease without esophagitis: Secondary | ICD-10-CM | POA: Insufficient documentation

## 2023-04-09 LAB — COMPREHENSIVE METABOLIC PANEL
ALT: 21 U/L (ref 0–44)
AST: 35 U/L (ref 15–41)
Albumin: 3.2 g/dL — ABNORMAL LOW (ref 3.5–5.0)
Alkaline Phosphatase: 39 U/L (ref 38–126)
Anion gap: 10 (ref 5–15)
BUN: 20 mg/dL (ref 8–23)
CO2: 21 mmol/L — ABNORMAL LOW (ref 22–32)
Calcium: 8.7 mg/dL — ABNORMAL LOW (ref 8.9–10.3)
Chloride: 100 mmol/L (ref 98–111)
Creatinine, Ser: 1.39 mg/dL — ABNORMAL HIGH (ref 0.44–1.00)
GFR, Estimated: 37 mL/min — ABNORMAL LOW (ref >60)
Glucose, Bld: 249 mg/dL — ABNORMAL HIGH (ref 70–99)
Potassium: 3.3 mmol/L — ABNORMAL LOW (ref 3.5–5.1)
Sodium: 131 mmol/L — ABNORMAL LOW (ref 135–145)
Total Bilirubin: 0.4 mg/dL (ref 0.3–1.2)
Total Protein: 5.9 g/dL — ABNORMAL LOW (ref 6.5–8.1)

## 2023-04-09 LAB — URINALYSIS, ROUTINE W REFLEX MICROSCOPIC
Bacteria, UA: NONE SEEN
Bilirubin Urine: NEGATIVE
Glucose, UA: 50 mg/dL — AB
Ketones, ur: NEGATIVE mg/dL
Leukocytes,Ua: NEGATIVE
Nitrite: NEGATIVE
Protein, ur: NEGATIVE mg/dL
Specific Gravity, Urine: 1.009 (ref 1.005–1.030)
pH: 5 (ref 5.0–8.0)

## 2023-04-09 LAB — BASIC METABOLIC PANEL
Anion gap: 13 (ref 5–15)
BUN: 20 mg/dL (ref 8–23)
CO2: 19 mmol/L — ABNORMAL LOW (ref 22–32)
Calcium: 8.9 mg/dL (ref 8.9–10.3)
Chloride: 102 mmol/L (ref 98–111)
Creatinine, Ser: 1.47 mg/dL — ABNORMAL HIGH (ref 0.44–1.00)
GFR, Estimated: 34 mL/min — ABNORMAL LOW (ref >60)
Glucose, Bld: 232 mg/dL — ABNORMAL HIGH (ref 70–99)
Potassium: 3.8 mmol/L (ref 3.5–5.1)
Sodium: 134 mmol/L — ABNORMAL LOW (ref 135–145)

## 2023-04-09 LAB — ECHOCARDIOGRAM COMPLETE
Area-P 1/2: 3.48 cm2
Height: 64 in
MV VTI: 1.81 cm2
S' Lateral: 2.7 cm
Weight: 3326.3 oz

## 2023-04-09 LAB — MAGNESIUM: Magnesium: 1.5 mg/dL — ABNORMAL LOW (ref 1.7–2.4)

## 2023-04-09 LAB — CBC
HCT: 33.7 % — ABNORMAL LOW (ref 36.0–46.0)
Hemoglobin: 10.8 g/dL — ABNORMAL LOW (ref 12.0–15.0)
MCH: 27.8 pg (ref 26.0–34.0)
MCHC: 32 g/dL (ref 30.0–36.0)
MCV: 86.9 fL (ref 80.0–100.0)
Platelets: 219 10*3/uL (ref 150–400)
RBC: 3.88 MIL/uL (ref 3.87–5.11)
RDW: 12.9 % (ref 11.5–15.5)
WBC: 6.9 10*3/uL (ref 4.0–10.5)
nRBC: 0 % (ref 0.0–0.2)

## 2023-04-09 LAB — HEMOGLOBIN A1C
Hgb A1c MFr Bld: 7.4 % — ABNORMAL HIGH (ref 4.8–5.6)
Mean Plasma Glucose: 165.68 mg/dL

## 2023-04-09 LAB — SODIUM, URINE, RANDOM: Sodium, Ur: 30 mmol/L

## 2023-04-09 LAB — GLUCOSE, CAPILLARY
Glucose-Capillary: 211 mg/dL — ABNORMAL HIGH (ref 70–99)
Glucose-Capillary: 152 mg/dL — ABNORMAL HIGH (ref 70–99)
Glucose-Capillary: 177 mg/dL — ABNORMAL HIGH (ref 70–99)
Glucose-Capillary: 213 mg/dL — ABNORMAL HIGH (ref 70–99)

## 2023-04-09 LAB — PHOSPHORUS: Phosphorus: 2.9 mg/dL (ref 2.5–4.6)

## 2023-04-09 LAB — LIPID PANEL
Cholesterol: 103 mg/dL (ref 0–200)
HDL: 30 mg/dL — ABNORMAL LOW (ref >40)
LDL Cholesterol: 46 mg/dL (ref 0–99)
Total CHOL/HDL Ratio: 3.4 RATIO
Triglycerides: 136 mg/dL (ref <150)
VLDL: 27 mg/dL (ref 0–40)

## 2023-04-09 MED ORDER — ONDANSETRON HCL 4 MG/2ML IJ SOLN: 4.0000 mg | Freq: Four times a day (QID) | INTRAMUSCULAR | Status: DC | PRN

## 2023-04-09 MED ORDER — ORAL CARE MOUTH RINSE: 15.0000 mL | OROMUCOSAL | Status: DC | PRN

## 2023-04-09 MED ORDER — SIMVASTATIN 20 MG PO TABS
20.0000 mg | ORAL_TABLET | Freq: Every day | ORAL | Status: DC
  Administered 2023-04-09 – 2023-04-10 (×2): 20 mg via ORAL
  Filled 2023-04-09 (×2): qty 1

## 2023-04-09 MED ORDER — ONDANSETRON HCL 4 MG PO TABS: 4.0000 mg | ORAL_TABLET | Freq: Four times a day (QID) | ORAL | Status: DC | PRN

## 2023-04-09 MED ORDER — ACETAMINOPHEN 325 MG PO TABS
650.0000 mg | ORAL_TABLET | Freq: Four times a day (QID) | ORAL | Status: DC | PRN
  Administered 2023-04-10: 650 mg via ORAL
  Filled 2023-04-09: qty 2

## 2023-04-09 MED ORDER — ALBUTEROL SULFATE (2.5 MG/3ML) 0.083% IN NEBU: 2.5000 mg | INHALATION_SOLUTION | Freq: Four times a day (QID) | RESPIRATORY_TRACT | Status: DC | PRN

## 2023-04-09 MED ORDER — ALBUTEROL SULFATE (2.5 MG/3ML) 0.083% IN NEBU
2.5000 mg | INHALATION_SOLUTION | Freq: Four times a day (QID) | RESPIRATORY_TRACT | Status: DC
  Administered 2023-04-09 (×2): 2.5 mg via RESPIRATORY_TRACT
  Filled 2023-04-09 (×2): qty 3

## 2023-04-09 MED ORDER — CLOPIDOGREL BISULFATE 75 MG PO TABS
75.0000 mg | ORAL_TABLET | Freq: Every day | ORAL | Status: DC
Start: 2023-04-09 — End: 2023-04-09

## 2023-04-09 MED ORDER — INSULIN ASPART 100 UNIT/ML IJ SOLN
0.0000 [IU] | Freq: Three times a day (TID) | INTRAMUSCULAR | Status: DC
  Administered 2023-04-09 (×2): 3 [IU] via SUBCUTANEOUS
  Administered 2023-04-09: 5 [IU] via SUBCUTANEOUS
  Administered 2023-04-10: 3 [IU] via SUBCUTANEOUS

## 2023-04-09 MED ORDER — POTASSIUM CHLORIDE CRYS ER 20 MEQ PO TBCR
40.0000 meq | EXTENDED_RELEASE_TABLET | Freq: Once | ORAL | Status: AC
  Administered 2023-04-09: 40 meq via ORAL
  Filled 2023-04-09: qty 2

## 2023-04-09 MED ORDER — ALBUTEROL SULFATE HFA 108 (90 BASE) MCG/ACT IN AERS: 2.0000 | INHALATION_SPRAY | Freq: Four times a day (QID) | RESPIRATORY_TRACT | Status: DC

## 2023-04-09 MED ORDER — PANTOPRAZOLE SODIUM 40 MG PO TBEC
40.0000 mg | DELAYED_RELEASE_TABLET | Freq: Every day | ORAL | Status: DC
  Administered 2023-04-09 – 2023-04-10 (×2): 40 mg via ORAL
  Filled 2023-04-09 (×2): qty 1

## 2023-04-09 MED ORDER — MAGNESIUM SULFATE 2 GM/50ML IV SOLN
2.0000 g | Freq: Once | INTRAVENOUS | Status: AC
  Administered 2023-04-09: 2 g via INTRAVENOUS
  Filled 2023-04-09: qty 50

## 2023-04-09 MED ORDER — CLOPIDOGREL BISULFATE 75 MG PO TABS
75.0000 mg | ORAL_TABLET | Freq: Every day | ORAL | Status: DC
  Administered 2023-04-09 – 2023-04-10 (×2): 75 mg via ORAL
  Filled 2023-04-09 (×2): qty 1

## 2023-04-09 MED ORDER — ACETAMINOPHEN 650 MG RE SUPP: 650.0000 mg | Freq: Four times a day (QID) | RECTAL | Status: DC | PRN

## 2023-04-09 MED ORDER — ASPIRIN 81 MG PO TBEC
81.0000 mg | DELAYED_RELEASE_TABLET | Freq: Every day | ORAL | Status: DC
  Administered 2023-04-09 – 2023-04-10 (×2): 81 mg via ORAL
  Filled 2023-04-09 (×2): qty 1

## 2023-04-09 MED ORDER — SODIUM CHLORIDE 0.9 % IV SOLN: INTRAVENOUS | Status: DC

## 2023-04-09 MED ORDER — INFLUENZA VAC A&B SURF ANT ADJ 0.5 ML IM SUSY
0.5000 mL | PREFILLED_SYRINGE | INTRAMUSCULAR | Status: DC
  Filled 2023-04-09: qty 0.5

## 2023-04-09 NOTE — Progress Notes (Addendum)
STROKE TEAM PROGRESS NOTE   BRIEF HPI [name] is a 89 y.o. female with history of diabetes, hypertension, hyperlipidemia, seen by neurology outpatient for progressive memory loss and multiple falls in 2019 presented for evaluation after sustaining a fall last night and found this morning at 11 AM laying in the bathroom by the family. Then she has been having intermittent expressive aphasia ever since 5:45 PM.   SIGNIFICANT HOSPITAL EVENTS   INTERIM HISTORY/SUBJECTIVE Patient describes sitting on the toilet and then going to stand up and feeling like her legs would not work and feeling weak prior to falling.  Upon further discussion it appears that she has had episodes with standing from prolonged sitting episode of feeling dizzy and off balance.  She reports that when EMS arrived that she was having trouble finding her words.  She states that she only had 1 episode of expressive aphasia and that was when EMS was present. While working with physical therapy today she reported dizziness while ambulating in the hallway.  She did have some PVCs while working with PT.  They are planning to do orthostatic vital signs on her.  OBJECTIVE  CBC    Component Value Date/Time   WBC 6.9 04/09/2023 0712   RBC 3.88 04/09/2023 0712   HGB 10.8 (L) 04/09/2023 0712   HCT 33.7 (L) 04/09/2023 0712   PLT 219 04/09/2023 0712   MCV 86.9 04/09/2023 0712   MCH 27.8 04/09/2023 0712   MCHC 32.0 04/09/2023 0712   RDW 12.9 04/09/2023 0712   LYMPHSABS 1.6 04/08/2023 1932   MONOABS 1.2 (H) 04/08/2023 1932   EOSABS 0.0 04/08/2023 1932   BASOSABS 0.0 04/08/2023 1932    BMET    Component Value Date/Time   NA 131 (L) 04/09/2023 0712   K 3.3 (L) 04/09/2023 0712   CL 100 04/09/2023 0712   CO2 21 (L) 04/09/2023 0712   GLUCOSE 249 (H) 04/09/2023 0712   BUN 20 04/09/2023 0712   CREATININE 1.39 (H) 04/09/2023 0712   CALCIUM 8.7 (L) 04/09/2023 0712   GFRNONAA 37 (L) 04/09/2023 0712    IMAGING past 24  hours MR BRAIN WO CONTRAST  Result Date: 04/09/2023 CLINICAL DATA:  Acute neurologic deficit EXAM: MRI HEAD WITHOUT CONTRAST TECHNIQUE: Multiplanar, multiecho pulse sequences of the brain and surrounding structures were obtained without intravenous contrast. COMPARISON:  None Available. FINDINGS: Brain: No acute infarct, mass effect or extra-axial collection. Multiple chronic microhemorrhages in a predominantly central distribution. There is confluent hyperintense T2-weighted signal within the white matter. Generalized volume loss. 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 The midline structures are normal. Vascular: Normal flow voids. Skull and upper cervical spine: Normal marrow signal. Sinuses/Orbits: Negative. Other: None. IMPRESSION: 1. No acute intracranial abnormality. 2. Multiple chronic microhemorrhages in a predominantly central distribution, consistent with hypertensive angiopathy. 3. Chronic ischemic microangiopathy Electronically Signed   By: Deatra Robinson M.D.   On: 04/09/2023 03:38   CT ANGIO HEAD NECK W WO CM W PERF (CODE STROKE)  Result Date: 04/08/2023 CLINICAL DATA:  Aphasia EXAM: CT ANGIOGRAPHY HEAD AND NECK CT PERFUSION BRAIN TECHNIQUE: Multidetector CT imaging of the head and neck was performed using the standard protocol during bolus administration of intravenous contrast. Multiplanar CT image reconstructions and MIPs were obtained to evaluate the vascular anatomy. Carotid stenosis measurements (when applicable) are obtained utilizing NASCET criteria, using the distal internal carotid diameter as the denominator. Multiphase CT imaging of the brain was performed following IV bolus contrast injection. Subsequent parametric perfusion  maps were calculated using RAPID software. RADIATION DOSE REDUCTION: This exam was performed according to the departmental dose-optimization program which includes automated exposure control, adjustment of the mA and/or kV according to patient size and/or use of  iterative reconstruction technique. CONTRAST:  OMNIPAQUE IOHEXOL 350 MG/ML SOLN COMPARISON:  CT 04/08/2023 FINDINGS: CTA NECK FINDINGS Aortic arch: Calcific aortic atherosclerosis. Normal branching pattern. Right carotid system: Mild atherosclerotic calcification of the carotid bifurcation and extending into the proximal external and internal carotid arteries. No hemodynamically significant stenosis. Left carotid system: Calcific atherosclerosis at the carotid bifurcation and standing into the proximal ICA. No hemodynamically significant stenosis. Vertebral arteries: Right dominant.  No stenosis or occlusion. Skeleton: Negative Other neck: Negative Upper chest: Negative Review of the MIP images confirms the above findings CTA HEAD FINDINGS Anterior circulation: --Intracranial internal carotid arteries: Atherosclerotic calcification with mild narrowing of the left ophthalmic segment. --Anterior cerebral arteries (ACA): Multifocal severe stenosis/short segment occlusion of the right anterior cerebral artery A2 segment. Mild multifocal narrowing of the left ACA. Both A1 segments are normal. --Middle cerebral arteries (MCA): Multifocal severe stenosis of the left MCA M2 branches, worst at the inferior division. Multifocal mild stenosis the right MCA. Posterior circulation: --Vertebral arteries: Left vertebral artery terminates in PICA. Normal right. --Inferior cerebellar arteries: Normal. --Basilar artery: Normal. --Superior cerebellar arteries: Normal. --Posterior cerebral arteries: No proximal occlusion or flow-limiting stenosis. Venous sinuses: As permitted by contrast timing, patent. Anatomic variants: None Review of the MIP images confirms the above findings CT Brain Perfusion Findings: ASPECTS: 10 CBF (<30%) Volume: 0mL Perfusion (Tmax>6.0s) volume: 0mL Mismatch Volume: 0mL Infarction Location:None IMPRESSION: 1. No emergent large vessel occlusion. 2. Multifocal severe stenosis/short segment occlusion of the  right anterior cerebral artery A2 segment. 3. Multifocal severe stenosis of the left MCA M2 branches, worst at the inferior division. 4. Bilateral carotid bifurcation atherosclerosis without hemodynamically significant stenosis. 5. Normal CT perfusion scan Aortic Atherosclerosis (ICD10-I70.0). Electronically Signed   By: Deatra Robinson M.D.   On: 04/08/2023 20:12   CT HEAD CODE STROKE WO CONTRAST  Result Date: 04/08/2023 CLINICAL DATA:  Code stroke. Neuro deficit, acute, stroke suspected. Fall last night. Hit head on toilet and remained on floor until this morning. Began having aphasia at 5:45 p.m. today. EXAM: CT HEAD WITHOUT CONTRAST TECHNIQUE: Contiguous axial images were obtained from the base of the skull through the vertex without intravenous contrast. RADIATION DOSE REDUCTION: This exam was performed according to the departmental dose-optimization program which includes automated exposure control, adjustment of the mA and/or kV according to patient size and/or use of iterative reconstruction technique. COMPARISON:  CT head without contrast 01/28/2017 FINDINGS: Brain: Progressive mild atrophy and moderate diffuse white matter disease is present. A lacunar infarct involving the left thalamus is new since the prior study but appears remote. Associated volume loss is present. The deep brain nuclei are otherwise within normal limits. No acute or focal cortical infarct is present. No acute hemorrhage is present. The brainstem and cerebellum are within normal limits. Midline structures are within normal limits. Vascular: Atherosclerotic calcifications are present within the cavernous internal carotid arteries bilaterally. No hyperdense vessel is present. Skull: Calvarium is intact. No focal lytic or blastic lesions are present. No significant extracranial soft tissue lesion is present. Sinuses/Orbits: Bilateral lens replacements are noted. Globes and orbits are otherwise unremarkable. The paranasal sinuses and  mastoid air cells are clear. ASPECTS [facility] Stroke Program Early CT Score) - Ganglionic level infarction (caudate, lentiform nuclei, internal capsule, insula, M1-M3 cortex):  7/7 - Supraganglionic infarction (M4-M6 cortex): 3/3 Total score (0-10 with 10 being normal): 10/10 IMPRESSION: 1. No acute intracranial abnormality. 2. Progressive mild atrophy and moderate diffuse white matter disease. This likely reflects the sequela of chronic microvascular ischemia. 3. Lacunar infarct involving the left thalamus is new since the prior study but appears remote. 4. Aspects is 10/10. These results were called by telephone at the time of interpretation on 04/08/2023 at 7:36 pm to provider [facility] ZAMMIT , who verbally acknowledged these results. Electronically Signed   By: Marin Roberts M.D.   On: 04/08/2023 19:37    Vitals:   04/08/23 2230 04/08/23 2345 04/09/23 0114 04/09/23 0403  BP: (!) 125/96 (!) 160/56 (!) 161/53 (!) 151/67  Pulse: 82 89 82 76  Resp: (!) 21 19 14 16   Temp:   98.8 F (37.1 C) 98.3 F (36.8 C)  TempSrc:   Oral Axillary  SpO2: 94% 95% 97% 96%  Weight:      Height:         PHYSICAL EXAM General:  Alert, well-nourished, well-developed patient in no acute distress Psych:  Mood and affect appropriate for situation CV: Regular rate and rhythm on monitor Respiratory:  Regular, unlabored respirations on room air GI: Abdomen soft and nontender  NEURO:  Mental Status: AA&Ox3, patient is able to give clear and coherent history Speech/Language: speech is without dysarthria or aphasia.  Naming, repetition, fluency, and comprehension intact.  Cranial Nerves:  II: PERRL. Visual fields full.  III, IV, VI: EOMI. Eyelids elevate symmetrically.  V: Sensation is intact to light touch and symmetrical to face.  VII: Face is symmetrical resting and smiling VIII: hearing intact to voice. IX, X: Palate elevates symmetrically. Phonation is normal.  WG:NFAOZHYQ shrug 5/5. XII: tongue is  midline without fasciculations. Motor: Elevates all extremities without drift Tone: is normal and bulk is normal Sensation- Intact to light touch bilaterally. Extinction absent to light touch to DSS.   Coordination: FTN intact bilaterally, HKS: no ataxia in BLE.No drift.  Gait- deferred  NIHSS: 0.  ASSESSMENT/PLAN  TIA vs syncopal episode Code Stroke CT head No acute abnormality.  Progressive mild atrophy and diffuse white matter disease.  Lacunar infarct involving the left thalamus appears remote.  Aspects 10/10 CTA head & neck no LVO.  Severe stenosis/short segment occlusion of the right ACA A2 segment.  Multifocal severe stenosis at the left MCA M2 branch.  Bilateral carotid bifurcation atherosclerosis without hemodynamically significant stenosis. MRI   No acute intracranial abnormality. Multiple chronic microhemorrhages in a predominantly central distribution, consistent with hypertensive angiopathy. Chronic ischemic microangiopathy 2D Echo 60%. LDL 46 HgbA1c No results found for requested labs within last 1095 days. VTE prophylaxis -SCDs aspirin 81 mg daily prior to admission, now on aspirin 81 mg daily and clopidogrel 75 mg daily for 3 weeks and then aspirin alone versus just aspirin pending completion of workup. Therapy recommendations:  Home Health PT and Home Health OT Disposition: Pending  Hypertension Home meds: Lisinopril, losartan, Coreg, hydrochlorothiazide, Norvasc(not taking) Stable Blood Pressure Goal: BP less than 220/110   Hyperlipidemia Home meds: Simvastatin LDL 46, goal < 70 Continue statin at discharge  Diabetes type II Controlled Home meds: Metformin HgbA1c No results found for requested labs within last 1095 days., goal < 7.0 CBGs SSI Recommend close follow-up with PCP for better DM control  Dysphagia Patient has post-stroke dysphagia, SLP consulted    Diet   Diet heart healthy/carb modified Room service appropriate? Yes with Assist; Fluid  consistency:  Thin   Advance diet as tolerated  Other Stroke Risk Factors Obesity, Body mass index is 35.68 kg/m., BMI >/= 30 associated with increased stroke risk, recommend weight loss, diet and exercise as appropriate  Dizziness-orthostatic vitals pending   Hospital day # 0 Patient seen and examined by NP/APP with MD. MD to update note as needed.   Elmer Picker, DNP, FNP-BC [facility] Pager: [phone]  ATTENDING ATTESTATION:  89 year old with what appears to be orthostatic hypotension by history.  She was sitting on the toilet she was try to get up fell hit her head and had a brief episode of confusion/inability to speak.  She is not back to her baseline.  MRI is negative.  I do not believe this is a stroke.  Will still recommend DAPT therapy for 3 weeks and then aspirin alone.  Orthostatics laying sitting and standing at 1-3-5 minutes to look for drops in systolic blood pressure greater than 20 points.  Discussed adequate hydration with the patient.  She can follow-up with her PCP.  Echo still pending.  ADDENDUM: echo is negative neurology sign off please call questions.  Dr. Viviann Spare evaluated pt independently, reviewed imaging, chart, labs. Discussed and formulated plan with the Resident/APP. Changes were made to the note where appropriate. Please see APP/resident note above for details.   Total 36 minutes spent on counseling patient and coordinating care, writing notes and reviewing chart.    Reannon Candella,MD   To contact Stroke Continuity provider, please refer to [email or web address]. After hours, contact General Neurology

## 2023-04-09 NOTE — Progress Notes (Signed)
Physical Therapy Evaluation Patient Details Name: [name] MRN: [id number] DOB: June 04, 1934 Today's Date: 04/09/2023  History of Present Illness  [name] is a 89 y.o. female who presented 9/20 to the emergency department from home via EMS due to intermittent, expressive dysphagia and slow speech. Imaging demonstrated no acute abnormality but chronic microhemorrhages and lacunar infarct in L thalamus. PMHx: hypertension, hyperlipidemia, GERD, type 2 diabetes mellitus  Clinical Impression  Pt admitted with above diagnosis. Mod I PTA using rollator intermittently. Pt reports she wears life-alert but it did not work when she fell at home (vs confused and did not press button?) States she felt dizzy rising from toilet at home when she fell. Also subjective report of this same dizziness while ambulating in hallway this afternoon (Feeling "faint.") This was transient and resolved by the time we returned to room. Telebox reported 17 beats of PVCs and trigeminy. RN notified- planning orthostatics test shortly.  Mobilized at supervision level, tried RW and rollator; she has both devices at home and prefers rollator. Encouraged use at all times due to hx of fall and is agreeable. Discussed safety and symptom awareness; to have life-alert checked if it is indeed malfunctioning will need to be replaced. Will follow and progress until d/c. Agreeable to HHPT follow-up for home safety evaluation. Pt currently with functional limitations due to the deficits listed below (see PT Problem List). Pt will benefit from acute skilled PT to increase their independence and safety with mobility to allow discharge.           If plan is discharge home, recommend the following: A little help with walking and/or transfers;Help with stairs or ramp for entrance;Assist for transportation;A little help with bathing/dressing/bathroom;Assistance with cooking/housework   Can travel by private vehicle    yes    Equipment  Recommendations None recommended by PT  Recommendations for Other Services       Functional Status Assessment Patient has had a recent decline in their functional status and demonstrates the ability to make significant improvements in function in a reasonable and predictable amount of time.     Precautions / Restrictions Precautions Precautions: Fall Restrictions Weight Bearing Restrictions: No      Mobility  Bed Mobility Overal bed mobility: Needs Assistance Bed Mobility: Supine to Sit, Sit to Supine     Supine to sit: Supervision Sit to supine: Supervision   General bed mobility comments: Slower and effortful but able to rise and lower to/from EOB without physical assist.    Transfers Overall transfer level: Needs assistance Equipment used: Rolling walker (2 wheels), Rollator (4 wheels) Transfers: Sit to/from Stand Sit to Stand: Supervision           General transfer comment: Supervision for safety, cues for set up, hand placement and technique. Able to perform with ea device. Educated on proximity to [employer] prior to sitting as she pushed it out of reach before sitting.    Ambulation/Gait Ambulation/Gait assistance: Supervision Gait Distance (Feet): 100 Feet (x2) Assistive device: Rolling walker (2 wheels), Rollator (4 wheels) Gait Pattern/deviations: Step-through pattern, Decreased stride length Gait velocity: decr Gait velocity interpretation: <1.8 ft/sec, indicate of risk for recurrent falls   General Gait Details: Minimal instability with gait, utilizing RW and rollator appropriate after education on proper use. No signs of LOB or overt buckling throughout. Able to navigate congested area and make sharp turns around objects with some effort. Pt did report transient dizziness while ambulating that resolved "faint," feeling - HR into  90s with telebox reporting multiple PVCs and trigeminy. Resolved by the time we returned to room; dynamap not immediately available,  RN notified, will be checking shortly.  Stairs            Wheelchair Mobility     Tilt Bed    Modified Rankin (Stroke Patients Only)       Balance Overall balance assessment: Needs assistance Sitting-balance support: Feet supported Sitting balance-Leahy Scale: Fair     Standing balance support: During functional activity, No upper extremity supported Standing balance-Leahy Scale: Fair Standing balance comment: statically                             Pertinent Vitals/Pain Pain Assessment Pain Assessment: No/denies pain Pain Intervention(s): Monitored during session, Repositioned    Home Living Family/patient expects to be discharged to:: Private residence Living Arrangements: Alone Available Help at Discharge: Family;Available PRN/intermittently Type of Home: House Home Access: Stairs to enter Entrance Stairs-Rails: Right;Left Entrance Stairs-Number of Steps: 2+3   Home Layout: One level Home Equipment: Agricultural consultant (2 wheels);Cane - single point;Shower seat      Prior Function Prior Level of Function : Independent/Modified Independent;Driving;History of Falls (last six months)             Mobility Comments: 1 recent fall ADLs Comments: indep, drives, cooks, cleans     Extremity/Trunk Assessment   Upper Extremity Assessment Upper Extremity Assessment: Defer to OT evaluation    Lower Extremity Assessment Lower Extremity Assessment: Generalized weakness    Cervical / Trunk Assessment Cervical / Trunk Assessment: Normal  Communication   Communication Communication: No apparent difficulties  Cognition Arousal: Alert Behavior During Therapy: WFL for tasks assessed/performed Overall Cognitive Status: Within Functional Limits for tasks assessed                                          General Comments General comments (skin integrity, edema, etc.): HR into 90s with gait, telebox reports 17 PVCs and Trigeminy,  dizziness reported. Rn notied.    Exercises     Assessment/Plan    PT Assessment Patient needs continued PT services  PT Problem List Decreased strength;Decreased activity tolerance;Decreased balance;Decreased mobility;Decreased knowledge of use of DME;Cardiopulmonary status limiting activity       PT Treatment Interventions DME instruction;Gait training;Stair training;Functional mobility training;Therapeutic activities;Therapeutic exercise;Balance training;Neuromuscular re-education;Patient/family education    PT Goals (Current goals can be found in the Care Plan section)  Acute Rehab PT Goals Patient Stated Goal: Go home PT Goal Formulation: With patient Time For Goal Achievement: 04/22/23 Potential to Achieve Goals: Good    Frequency Min 1X/week     Co-evaluation               AM-PAC PT "6 Clicks" Mobility  Outcome Measure Help needed turning from your back to your side while in a flat bed without using bedrails?: None Help needed moving from lying on your back to sitting on the side of a flat bed without using bedrails?: A Little Help needed moving to and from a bed to a chair (including a wheelchair)?: A Little Help needed standing up from a chair using your arms (e.g., wheelchair or bedside chair)?: A Little Help needed to walk in hospital room?: A Little Help needed climbing 3-5 steps with a railing? : A Little 6 Click Score: 19  End of Session Equipment Utilized During Treatment: Gait belt Activity Tolerance: Patient tolerated treatment well Patient left: in bed;with call bell/phone within reach;with bed alarm set Nurse Communication: Mobility status;Other (comment) (PVCs, dizziness) PT Visit Diagnosis: Other abnormalities of gait and mobility (R26.89);Muscle weakness (generalized) (M62.81);History of falling (Z91.81)    Time: 6295-2841 PT Time Calculation (min) (ACUTE ONLY): 20 min   Charges:   PT Evaluation $PT Eval Low Complexity: 1 Low   PT  General Charges $$ ACUTE PT VISIT: 1 Visit         Kathlyn [place], PT, DPT [facility]  Rehabilitation Services Physical Therapist Office: [phone] Website: [email or web address]   Berton Mount 04/09/2023, 1:31 PM

## 2023-04-09 NOTE — Progress Notes (Signed)
PROGRESS NOTE    [name]  JXB:[id number] DOB: [birth year] DOA: 04/08/2023 PCP: April Manson, NP  Outpatient Specialists:     Brief Narrative:  Patient is an 89 year old female with history of hypertension, hyperlipidemia, diabetes mellitus type 2 and GERD.  Patient was admitted with concerns for possible TIA.  Patient presented with intermittent expressive dysphasia and slurred speech.  04/09/2023: Patient seen alongside patient's granddaughter.  Granddaughter was updated extensively.  Above history noted.  Slurred speech has resolved.  UA that was suggestive of likely volume depletion.  Patient has been on IV fluids.  Patient continues to report dizziness.  Negative MRI brain.  Neurology team is directing care.   Assessment & Plan:   Principal Problem:   TIA (transient ischemic attack) Active Problems:   Hypokalemia   AKI (acute kidney injury) (HCC)   Essential hypertension   Mixed hyperlipidemia   GERD (gastroesophageal reflux disease)   Obesity (BMI 30-39.9)   Type 2 diabetes mellitus with hyperglycemia (HCC)   Transient ischemic attack: Patient will be admitted to telemetry unit  CT angiography of head and neck showed no emergent LVO CT head without contrast showed no acute intracranial abnormality Echocardiogram in the morning MRI of brain without contrast in the morning Continue aspirin and statin Continue fall precautions and neuro checks Lipid panel and hemoglobin A1c will be checked Continue PT/SLP/OT eval and treat Bedside swallow eval by nursing prior to diet Teleneurology consult appreciated Continue neurology consult and we shall await further recommendations 04/09/2023: Negative workup so far.  Neurology input is highly appreciated.   Hypokalemia: K+ 3.3, this will be replenished 04/09/2023: Repeat BMP.  Cautious hydration.   Acute kidney injury versus chronic kidney disease stage IIIb: Creatinine 1.37, most recent creatinine for comparison was 7 years  ago and it was 0.7-1.0) Continue gentle hydration Renally adjust medications, avoid nephrotoxic agents/dehydration/hypotension 04/09/2023: Follow-up repeat BMP.-Suspect patient has chronic kidney disease stage IIIb-stable versus AKI.  Volume depletion: -UA revealed specific gravity of 1.039. -Hydrate patient.   Type 2 diabetes mellitus with hyperglycemia: Hemoglobin A1c was 6.4 in April 2024 per medical record in Care Everywhere Continue ISS and hypoglycemia protocol Glimepiride and metformin will be held at this time 04/09/2023: Continue to monitor and optimize.   Essential hypertension:  Antihypertensives PRN if Blood pressure is greater than 220/120 or there is a concern for End organ damage/contraindications for permissive HTN. If blood pressure is greater than 220/120 give labetalol PO or IV or Vasotec IV with a goal of 15% reduction in BP during the first 24 hours.   Mixed hyperlipidemia: Continue Zocor   GERD: Continue Protonix   Obesity (BMI 35.68): Diet and lifestyle modification  Hypomagnesemia: -Magnesium of 1.5. -IV magnesium sulfate 2 g x 1 dose. -Repeat magnesium level in the morning.     DVT prophylaxis: SCD. Code Status: Full code. Family Communication:  Disposition Plan:    Consultants:  Neurology.  Procedures:  None  Antimicrobials:  None.   Subjective: -No new symptoms. -No motor deficits. -Slurred speech has resolved.  Objective: Vitals:   04/09/23 1121 04/09/23 1212 04/09/23 1400 04/09/23 1606  BP:  (!) 156/59  (!) 144/52  Pulse:  80 82 73  Resp:  18 18 17   Temp:  98.5 F (36.9 C)  97.9 F (36.6 C)  TempSrc:  Oral  Oral  SpO2: 97% 94% 94% 99%  Weight:      Height:        Intake/Output Summary (Last 24 hours) at  04/09/2023 1729 Last data filed at 04/09/2023 1306 Gross per 24 hour  Intake 491.52 ml  Output --  Net 491.52 ml   Filed Weights   04/08/23 2006  Weight: 94.3 kg    Examination:  General exam: Appears calm and  comfortable.  Patient is obese.  Awake and alert. Respiratory system: Clear to auscultation. Cardiovascular system: S1 & S2 heard. Gastrointestinal system: Abdomen is obese, soft and nontender.   Central nervous system: Alert and oriented.  No slurred speech.  Full power in all extremities.   Extremities: No leg edema.  Data Reviewed: I have personally reviewed following labs and imaging studies  CBC: Recent Labs  Lab 04/08/23 1932 04/09/23 0712  WBC 10.5 6.9  NEUTROABS 7.6  --   HGB 11.8* 10.8*  HCT 37.5 33.7*  MCV 89.1 86.9  PLT 263 219   Basic Metabolic Panel: Recent Labs  Lab 04/08/23 1932 04/09/23 0712  NA 133* 131*  K 3.3* 3.3*  CL 98 100  CO2 25 21*  GLUCOSE 215* 249*  BUN 23 20  CREATININE 1.37* 1.39*  CALCIUM 9.1 8.7*  MG  --  1.5*  PHOS  --  2.9   GFR: Estimated Creatinine Clearance: 31.7 mL/min (A) (by C-G formula based on SCr of 1.39 mg/dL (H)). Liver Function Tests: Recent Labs  Lab 04/08/23 1932 04/09/23 0712  AST 49* 35  ALT 26 21  ALKPHOS 44 39  BILITOT 0.7 0.4  PROT 7.1 5.9*  ALBUMIN 3.9 3.2*   No results for input(s): "LIPASE", "AMYLASE" in the last 168 hours. No results for input(s): "AMMONIA" in the last 168 hours. Coagulation Profile: Recent Labs  Lab 04/08/23 1932  INR 1.0   Cardiac Enzymes: No results for input(s): "CKTOTAL", "CKMB", "CKMBINDEX", "TROPONINI" in the last 168 hours. BNP (last 3 results) No results for input(s): "PROBNP" in the last 8760 hours. HbA1C: Recent Labs    04/09/23 0712  HGBA1C 7.4*   CBG: Recent Labs  Lab 04/09/23 0612 04/09/23 1209 04/09/23 1607  GLUCAP 211* 152* 177*   Lipid Profile: Recent Labs    04/09/23 0712  CHOL 103  HDL 30*  LDLCALC 46  TRIG 132  CHOLHDL 3.4   Thyroid Function Tests: No results for input(s): "TSH", "T4TOTAL", "FREET4", "T3FREE", "THYROIDAB" in the last 72 hours. Anemia Panel: No results for input(s): "VITAMINB12", "FOLATE", "FERRITIN", "TIBC", "IRON",  "RETICCTPCT" in the last 72 hours. Urine analysis:    Component Value Date/Time   COLORURINE YELLOW 04/08/2023 2243   APPEARANCEUR CLEAR 04/08/2023 2243   LABSPEC 1.039 (H) 04/08/2023 2243   PHURINE 5.0 04/08/2023 2243   GLUCOSEU NEGATIVE 04/08/2023 2243   HGBUR SMALL (A) 04/08/2023 2243   BILIRUBINUR NEGATIVE 04/08/2023 2243   KETONESUR NEGATIVE 04/08/2023 2243   PROTEINUR 100 (A) 04/08/2023 2243   UROBILINOGEN 0.2 12/25/2009 0900   NITRITE NEGATIVE 04/08/2023 2243   LEUKOCYTESUR NEGATIVE 04/08/2023 2243   Sepsis Labs: @LABRCNTIP (procalcitonin:4,lacticidven:4)  )No results found for this or any previous visit (from the past 240 hour(s)).       Radiology Studies: ECHOCARDIOGRAM COMPLETE  Result Date: 04/09/2023    ECHOCARDIOGRAM REPORT   Patient Name:   ALESSANDRIA STALDER Vanhecke Date of Exam: 04/09/2023 Medical Rec #:  [id number]   Height:       64.0 in Accession #:    [id number]  Weight:       207.9 lb Date of Birth:  06-29-1934    BSA:          1.989  m Patient Age:    67 years    BP:           156/59 mmHg Patient Gender: F           HR:           78 bpm. Exam Location:  Inpatient Procedure: 2D Echo, Color Doppler and Cardiac Doppler Indications:    TIA  History:        Patient has prior history of Echocardiogram examinations. Risk                 Factors:Hypertension, Dyslipidemia and Diabetes.  Sonographer:    Delcie Roch RDCS Referring Phys: [id number] OLADAPO ADEFESO IMPRESSIONS  1. Left ventricular ejection fraction, by estimation, is 60 to 65%. The left ventricle has normal function. The left ventricle has no regional wall motion abnormalities. Left ventricular diastolic parameters are consistent with Grade I diastolic dysfunction (impaired relaxation).  2. Right ventricular systolic function is normal. The right ventricular size is normal.  3. The mitral valve is normal in structure. No evidence of mitral valve regurgitation. No evidence of mitral stenosis.  4. The aortic valve is tricuspid.  There is mild calcification of the aortic valve. There is mild thickening of the aortic valve. Aortic valve regurgitation is not visualized. Aortic valve sclerosis/calcification is present, without any evidence of aortic stenosis.  5. The inferior vena cava is normal in size with greater than 50% respiratory variability, suggesting right atrial pressure of 3 mmHg. Conclusion(s)/Recommendation(s): No intracardiac source of embolism detected on this transthoracic study. Consider a transesophageal echocardiogram to exclude cardiac source of embolism if clinically indicated. FINDINGS  Left Ventricle: Left ventricular ejection fraction, by estimation, is 60 to 65%. The left ventricle has normal function. The left ventricle has no regional wall motion abnormalities. The left ventricular internal cavity size was normal in size. There is  no left ventricular hypertrophy. Left ventricular diastolic parameters are consistent with Grade I diastolic dysfunction (impaired relaxation). Right Ventricle: The right ventricular size is normal. No increase in right ventricular wall thickness. Right ventricular systolic function is normal. Left Atrium: Left atrial size was normal in size. Right Atrium: Right atrial size was normal in size. Pericardium: There is no evidence of pericardial effusion. Presence of epicardial fat layer. Mitral Valve: The mitral valve is normal in structure. There is mild thickening of the mitral valve leaflet(s). There is mild calcification of the mitral valve leaflet(s). No evidence of mitral valve regurgitation. No evidence of mitral valve stenosis. MV peak gradient, 12.8 mmHg. The mean mitral valve gradient is 5.0 mmHg. Tricuspid Valve: The tricuspid valve is normal in structure. Tricuspid valve regurgitation is not demonstrated. No evidence of tricuspid stenosis. Aortic Valve: The aortic valve is tricuspid. There is mild calcification of the aortic valve. There is mild thickening of the aortic valve.  Aortic valve regurgitation is not visualized. Aortic valve sclerosis/calcification is present, without any evidence of aortic stenosis. Pulmonic Valve: The pulmonic valve was normal in structure. Pulmonic valve regurgitation is not visualized. No evidence of pulmonic stenosis. Aorta: The aortic root is normal in size and structure. Venous: The inferior vena cava is normal in size with greater than 50% respiratory variability, suggesting right atrial pressure of 3 mmHg. IAS/Shunts: No atrial level shunt detected by color flow Doppler.  LEFT VENTRICLE PLAX 2D LVIDd:         4.60 cm   Diastology LVIDs:         2.70 cm   LV e'  medial:    6.74 cm/s LV PW:         1.10 cm   LV E/e' medial:  18.0 LV IVS:        1.10 cm   LV e' lateral:   6.09 cm/s LVOT diam:     2.00 cm   LV E/e' lateral: 19.9 LV SV:         68 LV SV Index:   34 LVOT Area:     3.14 cm  RIGHT VENTRICLE             IVC RV S prime:     12.40 cm/s  IVC diam: 1.40 cm TAPSE (M-mode): 1.8 cm LEFT ATRIUM             Index        RIGHT ATRIUM           Index LA diam:        3.10 cm 1.56 cm/m   RA Area:     10.80 cm LA Vol (A2C):   39.4 ml 19.81 ml/m  RA Volume:   24.70 ml  12.42 ml/m LA Vol (A4C):   44.2 ml 22.22 ml/m LA Biplane Vol: 43.8 ml 22.02 ml/m  AORTIC VALVE LVOT Vmax:   108.00 cm/s LVOT Vmean:  71.300 cm/s LVOT VTI:    0.218 m  AORTA Ao Root diam: 3.50 cm Ao Asc diam:  3.20 cm MITRAL VALVE MV Area (PHT): 3.48 cm     SHUNTS MV Area VTI:   1.81 cm     Systemic VTI:  0.22 m MV Peak grad:  12.8 mmHg    Systemic Diam: 2.00 cm MV Mean grad:  5.0 mmHg MV Vmax:       1.79 m/s MV Vmean:      105.0 cm/s MV Decel Time: 218 msec MV E velocity: 121.00 cm/s MV A velocity: 159.00 cm/s MV E/A ratio:  0.76 Donato Schultz MD Electronically signed by Donato Schultz MD Signature Date/Time: 04/09/2023/3:48:18 PM    Final    MR BRAIN WO CONTRAST  Result Date: 04/09/2023 CLINICAL DATA:  Acute neurologic deficit EXAM: MRI HEAD WITHOUT CONTRAST TECHNIQUE: Multiplanar,  multiecho pulse sequences of the brain and surrounding structures were obtained without intravenous contrast. COMPARISON:  None Available. FINDINGS: Brain: No acute infarct, mass effect or extra-axial collection. Multiple chronic microhemorrhages in a predominantly central distribution. There is confluent hyperintense T2-weighted signal within the white matter. Generalized volume loss. 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 The midline structures are normal. Vascular: Normal flow voids. Skull and upper cervical spine: Normal marrow signal. Sinuses/Orbits: Negative. Other: None. IMPRESSION: 1. No acute intracranial abnormality. 2. Multiple chronic microhemorrhages in a predominantly central distribution, consistent with hypertensive angiopathy. 3. Chronic ischemic microangiopathy Electronically Signed   By: Deatra Robinson M.D.   On: 04/09/2023 03:38   CT ANGIO HEAD NECK W WO CM W PERF (CODE STROKE)  Result Date: 04/08/2023 CLINICAL DATA:  Aphasia EXAM: CT ANGIOGRAPHY HEAD AND NECK CT PERFUSION BRAIN TECHNIQUE: Multidetector CT imaging of the head and neck was performed using the standard protocol during bolus administration of intravenous contrast. Multiplanar CT image reconstructions and MIPs were obtained to evaluate the vascular anatomy. Carotid stenosis measurements (when applicable) are obtained utilizing NASCET criteria, using the distal internal carotid diameter as the denominator. Multiphase CT imaging of the brain was performed following IV bolus contrast injection. Subsequent parametric perfusion maps were calculated using RAPID software. RADIATION DOSE REDUCTION: This exam was performed according to the  departmental dose-optimization program which includes automated exposure control, adjustment of the mA and/or kV according to patient size and/or use of iterative reconstruction technique. CONTRAST:  OMNIPAQUE IOHEXOL 350 MG/ML SOLN COMPARISON:  CT 04/08/2023 FINDINGS: CTA NECK FINDINGS Aortic arch:  Calcific aortic atherosclerosis. Normal branching pattern. Right carotid system: Mild atherosclerotic calcification of the carotid bifurcation and extending into the proximal external and internal carotid arteries. No hemodynamically significant stenosis. Left carotid system: Calcific atherosclerosis at the carotid bifurcation and standing into the proximal ICA. No hemodynamically significant stenosis. Vertebral arteries: Right dominant.  No stenosis or occlusion. Skeleton: Negative Other neck: Negative Upper chest: Negative Review of the MIP images confirms the above findings CTA HEAD FINDINGS Anterior circulation: --Intracranial internal carotid arteries: Atherosclerotic calcification with mild narrowing of the left ophthalmic segment. --Anterior cerebral arteries (ACA): Multifocal severe stenosis/short segment occlusion of the right anterior cerebral artery A2 segment. Mild multifocal narrowing of the left ACA. Both A1 segments are normal. --Middle cerebral arteries (MCA): Multifocal severe stenosis of the left MCA M2 branches, worst at the inferior division. Multifocal mild stenosis the right MCA. Posterior circulation: --Vertebral arteries: Left vertebral artery terminates in PICA. Normal right. --Inferior cerebellar arteries: Normal. --Basilar artery: Normal. --Superior cerebellar arteries: Normal. --Posterior cerebral arteries: No proximal occlusion or flow-limiting stenosis. Venous sinuses: As permitted by contrast timing, patent. Anatomic variants: None Review of the MIP images confirms the above findings CT Brain Perfusion Findings: ASPECTS: 10 CBF (<30%) Volume: 0mL Perfusion (Tmax>6.0s) volume: 0mL Mismatch Volume: 0mL Infarction Location:None IMPRESSION: 1. No emergent large vessel occlusion. 2. Multifocal severe stenosis/short segment occlusion of the right anterior cerebral artery A2 segment. 3. Multifocal severe stenosis of the left MCA M2 branches, worst at the inferior division. 4. Bilateral  carotid bifurcation atherosclerosis without hemodynamically significant stenosis. 5. Normal CT perfusion scan Aortic Atherosclerosis (ICD10-I70.0). Electronically Signed   By: Deatra Robinson M.D.   On: 04/08/2023 20:12   CT HEAD CODE STROKE WO CONTRAST  Result Date: 04/08/2023 CLINICAL DATA:  Code stroke. Neuro deficit, acute, stroke suspected. Fall last night. Hit head on toilet and remained on floor until this morning. Began having aphasia at 5:45 p.m. today. EXAM: CT HEAD WITHOUT CONTRAST TECHNIQUE: Contiguous axial images were obtained from the base of the skull through the vertex without intravenous contrast. RADIATION DOSE REDUCTION: This exam was performed according to the departmental dose-optimization program which includes automated exposure control, adjustment of the mA and/or kV according to patient size and/or use of iterative reconstruction technique. COMPARISON:  CT head without contrast 01/28/2017 FINDINGS: Brain: Progressive mild atrophy and moderate diffuse white matter disease is present. A lacunar infarct involving the left thalamus is new since the prior study but appears remote. Associated volume loss is present. The deep brain nuclei are otherwise within normal limits. No acute or focal cortical infarct is present. No acute hemorrhage is present. The brainstem and cerebellum are within normal limits. Midline structures are within normal limits. Vascular: Atherosclerotic calcifications are present within the cavernous internal carotid arteries bilaterally. No hyperdense vessel is present. Skull: Calvarium is intact. No focal lytic or blastic lesions are present. No significant extracranial soft tissue lesion is present. Sinuses/Orbits: Bilateral lens replacements are noted. Globes and orbits are otherwise unremarkable. The paranasal sinuses and mastoid air cells are clear. ASPECTS [facility] Stroke Program Early CT Score) - Ganglionic level infarction (caudate, lentiform nuclei, internal  capsule, insula, M1-M3 cortex): 7/7 - Supraganglionic infarction (M4-M6 cortex): 3/3 Total score (0-10 with 10 being normal): 10/10 IMPRESSION:  1. No acute intracranial abnormality. 2. Progressive mild atrophy and moderate diffuse white matter disease. This likely reflects the sequela of chronic microvascular ischemia. 3. Lacunar infarct involving the left thalamus is new since the prior study but appears remote. 4. Aspects is 10/10. These results were called by telephone at the time of interpretation on 04/08/2023 at 7:36 pm to provider [facility] ZAMMIT , who verbally acknowledged these results. Electronically Signed   By: Marin Roberts M.D.   On: 04/08/2023 19:37        Scheduled Meds:  albuterol  2.5 mg Nebulization Q6H   aspirin EC  81 mg Oral Daily   clopidogrel  75 mg Oral Daily   [START ON 04/10/2023] influenza vaccine adjuvanted  0.5 mL Intramuscular Tomorrow-1000   insulin aspart  0-15 Units Subcutaneous TID WC   pantoprazole  40 mg Oral Daily   simvastatin  20 mg Oral Daily   Continuous Infusions:  sodium chloride 100 mL/hr at 04/09/23 1610     LOS: 0 days    Time spent: 35 minutes.    Berton Mount, MD  [facility] Pager #: [phone] 7PM-7AM contact night coverage as above

## 2023-04-09 NOTE — ED Notes (Signed)
Pt ambulatory.

## 2023-04-09 NOTE — Plan of Care (Signed)

## 2023-04-09 NOTE — Progress Notes (Signed)
  Echocardiogram 2D Echocardiogram has been performed.  Delcie Roch 04/09/2023, 3:37 PM

## 2023-04-09 NOTE — Progress Notes (Signed)
SLP Cancellation Note  Patient Details Name: [name] MRN: [id number] DOB: 03-Jun-1934   Cancelled treatment:       Reason Eval/Treat Not Completed: SLP screened, no needs identified in regards to swallow function. Pt passed [facility] swallow screen on 04/09/23 and RN reports no difficulties with meals/meds. Pt on diet. Will sign off.    Avie Echevaria, MA, CCC-SLP Acute Rehabilitation Services Office Number: [phone]  [name] 04/09/2023, 2:17 PM

## 2023-04-09 NOTE — Procedures (Signed)
Patient Name: MARALENE MANFRA  MRN: [id number]  Epilepsy Attending: Charlsie Quest  Referring Physician/Provider: Elmer Picker, NP  Date: 04/09/2023 Duration: 25.02 mins  Patient history: 89yo f with syncope and expressive aphasia getting eeg to evaluate for seizure  Level of alertness: Awake, asleep  AEDs during EEG study: None  Technical aspects: This EEG study was done with scalp electrodes positioned according to the 10-20 International system of electrode placement. Electrical activity was reviewed with band pass filter of 1-70Hz , sensitivity of 7 uV/mm, display speed of 9mm/sec with a 60Hz  notched filter applied as appropriate. EEG data were recorded continuously and digitally stored.  Video monitoring was available and reviewed as appropriate.  Description: The posterior dominant rhythm consists of 9-10 Hz activity of moderate voltage (25-35 uV) seen predominantly in posterior head regions, symmetric and reactive to eye opening and eye closing. Sleep was characterized by vertex waves, sleep spindles (12 to 14 Hz), maximal frontocentral region. Physiologic photic driving was seen during photic stimulation.  Hyperventilation was not performed.     IMPRESSION: This study is within normal limits. No seizures or epileptiform discharges were seen throughout the recording.  A normal interictal EEG does not exclude the diagnosis of epilepsy.  Lakisa Lotz Annabelle Harman

## 2023-04-09 NOTE — Progress Notes (Signed)
PT Cancellation Note  Patient Details Name: [name] MRN: [id number] DOB: October 15, 1933   Cancelled Treatment:    Reason Eval/Treat Not Completed: Patient at procedure or test/unavailable EEG team in room. Will check back again this afternoon for PT evaluation.  Kathlyn Sacramento, PT, DPT [facility]  Rehabilitation Services Physical Therapist Office: [phone] Website: [email or web address]

## 2023-04-09 NOTE — Evaluation (Signed)
Occupational Therapy Evaluation Patient Details Name: [name] MRN: [id number] DOB: 1933/12/04 Today's Date: 04/09/2023   History of Present Illness [name] is a 89 y.o. female who presented to the emergency department from home via EMS due to intermittent, expressive dysphagia and slow speech. Imaging demonstrated no acute abnormality but chronic microhemorrhages and lacunar infarct in L thalamus. PMHx: hypertension, hyperlipidemia, GERD, type 2 diabetes mellitus   Clinical Impression   [name] was evaluated s/p the above admission list. She is indep and lives alone at baseline. Upon evaluation the pt was limited by generalized weakness, soreness and decreased activity tolerance. Overall she needed min A for bed mobility and transfers, and CGA for mobility within the room with RW. Due to the deficits listed below the pt also needs up to min A for LB ADLs and set up A for UB ADLs in sitting. Anticipate good profess acutely. Pt will benefit from continued acute OT services and HHOT.        If plan is discharge home, recommend the following: A little help with walking and/or transfers;A little help with bathing/dressing/bathroom;Assistance with cooking/housework;Direct supervision/assist for medications management;Direct supervision/assist for financial management;Assist for transportation;Help with stairs or ramp for entrance    Functional Status Assessment  Patient has had a recent decline in their functional status and demonstrates the ability to make significant improvements in function in a reasonable and predictable amount of time.        Precautions / Restrictions Precautions Precautions: Fall Restrictions Weight Bearing Restrictions: No      Mobility Bed Mobility Overal bed mobility: Needs Assistance Bed Mobility: Supine to Sit     Supine to sit: Min assist     General bed mobility comments: assist for LB ADLs, needs increased time    Transfers Overall transfer level:  Needs assistance Equipment used: Rolling walker (2 wheels) Transfers: Sit to/from Stand Sit to Stand: Min assist                  Balance Overall balance assessment: Needs assistance Sitting-balance support: Feet supported Sitting balance-Leahy Scale: Fair     Standing balance support: Single extremity supported, During functional activity Standing balance-Leahy Scale: Fair Standing balance comment: statically                           ADL either performed or assessed with clinical judgement   ADL Overall ADL's : Needs assistance/impaired Eating/Feeding: Independent   Grooming: Contact guard assist   Upper Body Bathing: Set up;Sitting   Lower Body Bathing: Contact guard assist;Sit to/from stand   Upper Body Dressing : Set up;Sitting   Lower Body Dressing: Minimal assistance;Sit to/from stand   Toilet Transfer: Minimal assistance;Ambulation;Regular Toilet;Rolling walker (2 wheels);Grab bars   Toileting- Clothing Manipulation and Hygiene: Supervision/safety;Sitting/lateral lean       Functional mobility during ADLs: Minimal assistance;Rolling walker (2 wheels)       Vision Baseline Vision/History: 0 No visual deficits Vision Assessment?: No apparent visual deficits     Perception Perception: Not tested       Praxis Praxis: Not tested       Pertinent Vitals/Pain Pain Assessment Pain Assessment: Faces Faces Pain Scale: Hurts a little bit Pain Location: all over Pain Descriptors / Indicators: Sore Pain Intervention(s): Limited activity within patient's tolerance, Monitored during session     Extremity/Trunk Assessment Upper Extremity Assessment Upper Extremity Assessment: Generalized weakness   Lower Extremity Assessment Lower Extremity Assessment: Defer to  PT evaluation   Cervical / Trunk Assessment Cervical / Trunk Assessment: Normal   Communication Communication Communication: No apparent difficulties   Cognition Arousal:  Alert Behavior During Therapy: WFL for tasks assessed/performed Overall Cognitive Status: Within Functional Limits for tasks assessed                                       General Comments  Vss on RA, son present and supportive    Exercises     Shoulder Instructions      Home Living Family/patient expects to be discharged to:: Private residence Living Arrangements: Alone Available Help at Discharge: Family;Available PRN/intermittently Type of Home: House Home Access: Stairs to enter Entrance Stairs-Number of Steps: 2+3 Entrance Stairs-Rails: Right;Left Home Layout: One level     Bathroom Shower/Tub: Chief Strategy Officer: Standard     Home Equipment: Agricultural consultant (2 wheels);Cane - single point;Shower seat          Prior Functioning/Environment Prior Level of Function : Independent/Modified Independent;Driving;History of Falls (last six months)             Mobility Comments: 1 recent fall ADLs Comments: indep, drives, cooks, cleans        OT Problem List: Decreased strength;Decreased range of motion;Decreased activity tolerance;Impaired balance (sitting and/or standing);Decreased safety awareness;Decreased knowledge of use of DME or AE;Decreased knowledge of precautions;Pain      OT Treatment/Interventions: Self-care/ADL training;Therapeutic exercise;DME and/or AE instruction;Therapeutic activities;Patient/family education;Balance training    OT Goals(Current goals can be found in the care plan section) Acute Rehab OT Goals Patient Stated Goal: home OT Goal Formulation: With patient Time For Goal Achievement: 04/23/23 Potential to Achieve Goals: Good ADL Goals Pt Will Perform Lower Body Dressing: with modified independence;sit to/from stand Pt Will Transfer to Toilet: with modified independence;ambulating Additional ADL Goal #1: Pt will complete at least 10 minutes of OOB activity in standing to demonstrate increased tolerance  for ADLs at home  OT Frequency: Min 1X/week       AM-PAC OT "6 Clicks" Daily Activity     Outcome Measure Help from another person eating meals?: None Help from another person taking care of personal grooming?: A Little Help from another person toileting, which includes using toliet, bedpan, or urinal?: A Little Help from another person bathing (including washing, rinsing, drying)?: A Little Help from another person to put on and taking off regular upper body clothing?: A Little Help from another person to put on and taking off regular lower body clothing?: A Little 6 Click Score: 19   End of Session Equipment Utilized During Treatment: Gait belt;Rolling walker (2 wheels) Nurse Communication: Mobility status  Activity Tolerance: Patient tolerated treatment well Patient left: in chair;with call bell/phone within reach;with chair alarm set  OT Visit Diagnosis: Unsteadiness on feet (R26.81);Other abnormalities of gait and mobility (R26.89);Muscle weakness (generalized) (M62.81);History of falling (Z91.81);Pain                Time: 1610-9604 OT Time Calculation (min): 32 min Charges:  OT General Charges $OT Visit: 1 Visit OT Evaluation $OT Eval Moderate Complexity: 1 Mod OT Treatments $Self Care/Home Management : 8-22 mins  Derenda Mis, OTR/L Acute Rehabilitation Services Office [phone] Secure Chat Communication Preferred   Donia Pounds 04/09/2023, 9:39 AM

## 2023-04-09 NOTE — Plan of Care (Signed)

## 2023-04-09 NOTE — Progress Notes (Signed)
EEG complete - results pending 

## 2023-04-10 DIAGNOSIS — G459 Transient cerebral ischemic attack, unspecified: Secondary | ICD-10-CM | POA: Diagnosis not present

## 2023-04-10 LAB — CBC WITH DIFFERENTIAL/PLATELET
Abs Immature Granulocytes: 0.01 10*3/uL (ref 0.00–0.07)
Basophils Absolute: 0 10*3/uL (ref 0.0–0.1)
Basophils Relative: 0 %
Eosinophils Absolute: 0.1 10*3/uL (ref 0.0–0.5)
Eosinophils Relative: 2 %
HCT: 32.6 % — ABNORMAL LOW (ref 36.0–46.0)
Hemoglobin: 10.5 g/dL — ABNORMAL LOW (ref 12.0–15.0)
Immature Granulocytes: 0 %
Lymphocytes Relative: 29 %
Lymphs Abs: 1.8 10*3/uL (ref 0.7–4.0)
MCH: 28.1 pg (ref 26.0–34.0)
MCHC: 32.2 g/dL (ref 30.0–36.0)
MCV: 87.2 fL (ref 80.0–100.0)
Monocytes Absolute: 0.6 10*3/uL (ref 0.1–1.0)
Monocytes Relative: 10 %
Neutro Abs: 3.6 10*3/uL (ref 1.7–7.7)
Neutrophils Relative %: 59 %
Platelets: 248 10*3/uL (ref 150–400)
RBC: 3.74 MIL/uL — ABNORMAL LOW (ref 3.87–5.11)
RDW: 12.8 % (ref 11.5–15.5)
WBC: 6.1 10*3/uL (ref 4.0–10.5)
nRBC: 0 % (ref 0.0–0.2)

## 2023-04-10 LAB — GLUCOSE, CAPILLARY
Glucose-Capillary: 116 mg/dL — ABNORMAL HIGH (ref 70–99)
Glucose-Capillary: 180 mg/dL — ABNORMAL HIGH (ref 70–99)

## 2023-04-10 LAB — RENAL FUNCTION PANEL
Albumin: 3.1 g/dL — ABNORMAL LOW (ref 3.5–5.0)
Anion gap: 12 (ref 5–15)
BUN: 14 mg/dL (ref 8–23)
CO2: 21 mmol/L — ABNORMAL LOW (ref 22–32)
Calcium: 8.7 mg/dL — ABNORMAL LOW (ref 8.9–10.3)
Chloride: 106 mmol/L (ref 98–111)
Creatinine, Ser: 1.3 mg/dL — ABNORMAL HIGH (ref 0.44–1.00)
GFR, Estimated: 40 mL/min — ABNORMAL LOW (ref >60)
Glucose, Bld: 117 mg/dL — ABNORMAL HIGH (ref 70–99)
Phosphorus: 2.8 mg/dL (ref 2.5–4.6)
Potassium: 3.7 mmol/L (ref 3.5–5.1)
Sodium: 139 mmol/L (ref 135–145)

## 2023-04-10 LAB — MAGNESIUM: Magnesium: 2.1 mg/dL (ref 1.7–2.4)

## 2023-04-10 MED ORDER — CLOPIDOGREL BISULFATE 75 MG PO TABS: 75.0000 mg | ORAL_TABLET | Freq: Every day | ORAL | 0 refills | Status: AC

## 2023-04-10 MED ORDER — PANTOPRAZOLE SODIUM 40 MG PO TBEC: 40.0000 mg | DELAYED_RELEASE_TABLET | Freq: Every day | ORAL | 0 refills | Status: DC

## 2023-04-10 MED ORDER — PHENOL 1.4 % MT LIQD
1.0000 | OROMUCOSAL | Status: DC | PRN
  Administered 2023-04-10: 1 via OROMUCOSAL
  Filled 2023-04-10: qty 177

## 2023-04-10 MED ORDER — GUAIFENESIN-DM 100-10 MG/5ML PO SYRP
5.0000 mL | ORAL_SOLUTION | ORAL | Status: DC | PRN
  Administered 2023-04-10 (×2): 5 mL via ORAL
  Filled 2023-04-10 (×2): qty 5

## 2023-04-10 NOTE — TOC Transition Note (Addendum)
Transition of Care [facility]) - CM/SW Discharge Note   Patient Details  Name: [name] MRN: [id number] Date of Birth: 09/28/1933  [facility] CM/SW Contact:  Lawerance Sabal, RN Phone Number: 04/10/2023, 1:38 PM   Clinical Narrative:     Sherron Monday w patient and granddaughter Shanda Bumps [phone] in the room. They would like a RW for home and [facility] services w [facility].  Dan Humphreys will be delivered to the room through [facility] prior to DC. Family will provide transportation home  Will need HH order and face to face for [facility] PT OT   Final next level of care: Home w Home Health Services Barriers to Discharge: No Barriers Identified   Patient Goals and CMS Choice CMS Medicare.gov Compare Post Acute Care list provided to:: Patient Choice offered to / list presented to : Patient  Discharge Placement                         Discharge Plan and Services Additional resources added to the After Visit Summary for                  DME Arranged: Walker rolling DME Agency: [employer] Date DME Agency Contacted: 04/10/23 Time DME Agency Contacted: 1338 Representative spoke with at DME Agency: Vaughan Basta HH Arranged: PT, OT HH Agency: [facility] Date HH Agency Contacted: 04/10/23 Time HH Agency Contacted: 1338 Representative spoke with at [facility] Agency: Morrie Sheldon  Social Determinants of Health (SDOH) Interventions SDOH Screenings   Food Insecurity: No Food Insecurity (04/09/2023)  Housing: Low Risk  (04/09/2023)  Transportation Needs: No Transportation Needs (04/09/2023)  Utilities: Not At Risk (04/09/2023)  Financial Resource Strain: Low Risk  (12/14/2022)   Received from [facility]  Physical Activity: Inactive (12/14/2022)   Received from [facility]  Social Connections: Moderately Integrated (12/14/2022)   Received from [facility]  Stress: No Stress Concern Present (12/14/2022)   Received from [facility]  Tobacco Use: Low Risk  (04/08/2023)   Recent Concern: Tobacco Use - Medium Risk (03/16/2023)   [facility] from [facility]     Readmission Risk Interventions     No data to display

## 2023-04-10 NOTE — Care Management Obs Status (Signed)
MEDICARE OBSERVATION STATUS NOTIFICATION   Patient Details  Name: [name] MRN: [id number] Date of Birth: 17-May-1934   Medicare Observation Status Notification Given:  Yes    Lawerance Sabal, RN 04/10/2023, 7:30 AM

## 2023-04-10 NOTE — Discharge Summary (Signed)
Physician Discharge Summary  Patient ID: BONA RAVI MRN: [id number] DOB/AGE: [birth year] 89 y.o.  Admit date: 04/08/2023 Discharge date: 04/10/2023  Admission Diagnoses:  Discharge Diagnoses:  Principal Problem:   TIA (transient ischemic attack) Active Problems:   Hypokalemia   AKI (acute kidney injury) (HCC)   Essential hypertension   Mixed hyperlipidemia   GERD (gastroesophageal reflux disease)   Obesity (BMI 30-39.9)   Type 2 diabetes mellitus with hyperglycemia [facility])   Discharged Condition: stable  Hospital Course:  Patient is an 89 year old female with history of hypertension, hyperlipidemia, diabetes mellitus type 2 and GERD.  Patient was admitted with concerns for possible TIA.  Patient presented with intermittent expressive dysphasia and slurred speech.  Patient was also noted to be volume depleted.  Likely chronic kidney disease stage IIIb was also noted.  Patient was admitted for further assessment and management.  Neurology team was consulted to assist with patient's management.  Patient had an MRI of the brain that was negative for acute stroke.  Patient has been volume repleted.  Slurred speech has resolved.  However, patient continued to complain of dizziness.  Physical therapy was consulted.  Patient will be discharged back home with PT OT follow-up.  Neurology team has cleared patient for discharge.  Orthostatics came back negative.  Patient will follow-up with primary care provider and neurology team on discharge.   Transient ischemic attack: -Patient was observed in the telemetry unit.    -CT angiography of head and neck showed no emergent LVO -CT head without contrast showed no acute intracranial abnormality -Echocardiogram was nonrevealing.   -MRI brain was negative for any acute findings.   -Patient will continue aspirin and Plavix for 3 weeks, then aspirin alone. -Neurology team is cleared patient for discharge.     Hypokalemia: -Potassium of 3.3. -Potassium  was repleted. -Last potassium level prior to discharge was 3.8 -Patient was also managed with IV magnesium.  Magnesium was 1.5.    Likely chronic kidney disease stage IIIb:  Creatinine 1.37 Volume depletion was also noted. -Patient has been rehydrated.     Volume depletion: -UA revealed specific gravity of 1.039. -Patient was hydrated.     Type 2 diabetes mellitus with hyperglycemia: Hemoglobin A1c was 6.4 in April 2024 per medical record in Care Everywhere Continue ISS and hypoglycemia protocol Glimepiride and metformin will be held at this time Continue to monitor and optimize.   Essential hypertension: -Monitor and optimize.   Mixed hyperlipidemia: Continue Zocor   GERD: Continue Protonix   Obesity (BMI 35.68): Diet and lifestyle modification   Hypomagnesemia: -Magnesium of 1.5. -IV magnesium sulfate 2 g x 1 dose. -Magnesium level prior to discharge was 2.1.      Consults: neurology  Significant Diagnostic Studies:  MRI head without contrast reveals: Brain: No acute infarct, mass effect or extra-axial collection. Multiple chronic microhemorrhages in a predominantly central distribution. There is confluent hyperintense T2-weighted signal within the white matter. Generalized volume loss. 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 The midline structures are normal.   Vascular: Normal flow voids.   Skull and upper cervical spine: Normal marrow signal.   Sinuses/Orbits: Negative.   Other: None.   IMPRESSION: 1. No acute intracranial abnormality. 2. Multiple chronic microhemorrhages in a predominantly central distribution, consistent with hypertensive angiopathy. 3. Chronic ischemic microangiopathy      Discharge Exam: Blood pressure (!) 192/73, pulse 79, temperature 97.7 F (36.5 C), temperature source Oral, resp. rate 18, height 5\' 4"  (1.626 m), weight 94.3 kg, SpO2 98%.  Disposition: Discharge disposition: 06-Home-Health Care  Svc       Discharge Instructions     Diet - low sodium heart healthy   Complete by: As directed    Diet Carb Modified   Complete by: As directed    Increase activity slowly   Complete by: As directed       Allergies as of 04/10/2023   No Known Allergies      Medication List     STOP taking these medications    CINNAMON PO   esomeprazole 20 MG capsule Commonly known as: NEXIUM Replaced by: pantoprazole 40 MG tablet   GARLIC PO   hydrochlorothiazide 25 MG tablet Commonly known as: HYDRODIURIL   lisinopril 10 MG tablet Commonly known as: ZESTRIL   Norvasc 2.5 MG tablet Generic drug: amLODipine       TAKE these medications    albuterol 108 (90 Base) MCG/ACT inhaler Commonly known as: VENTOLIN HFA Inhale 2 puffs into the lungs as needed for wheezing or shortness of breath.   aspirin EC 81 MG tablet Take 81 mg by mouth daily.   carvedilol 6.25 MG tablet Commonly known as: COREG Take 6.25 mg by mouth in the morning and at bedtime.   clopidogrel 75 MG tablet Commonly known as: PLAVIX Take 1 tablet (75 mg total) by mouth daily for 21 days. Start taking on: April 11, 2023   FISH OIL PO Take 2 capsules by mouth daily.   losartan 50 MG tablet Commonly known as: COZAAR Take 50 mg by mouth daily.   metFORMIN 500 MG tablet Commonly known as: GLUCOPHAGE Take 500 mg by mouth in the morning and at bedtime.   multivitamin with minerals Tabs tablet Take 1 tablet by mouth daily.   pantoprazole 40 MG tablet Commonly known as: PROTONIX Take 1 tablet (40 mg total) by mouth daily. Start taking on: April 11, 2023 Replaces: esomeprazole 20 MG capsule   simvastatin 20 MG tablet Commonly known as: ZOCOR Take 20 mg by mouth at bedtime.   VITAMIN B-12 PO Take 1 capsule by mouth daily.   VITAMIN C PO Take 1 tablet by mouth daily.               Durable Medical Equipment  (From admission, onward)           Start     Ordered    04/10/23 1342  For home use only DME Walker rolling  Once       Question Answer Comment  Walker: With 5 Inch Wheels   Patient needs a walker to treat with the following condition Weakness      04/10/23 1341            Follow-up Information     [place] [place] Follow up.   Why: for home health services Contact information: [address] [place] [address] [phone]                Time spent: 35 minutes.  SignedBarnetta Chapel 04/10/2023, 2:25 PM

## 2023-04-10 NOTE — Progress Notes (Signed)
Patient ready for discharge to home; discharge instructions given and reviewed; Rx's sent electronically; patient assisted to dress and discharged out via wheelchair with her rolling walker; accompanied home by her granddaughter. All belongings returned.

## 2023-04-28 ENCOUNTER — Other Ambulatory Visit: Payer: Self-pay | Admitting: Internal Medicine

## 2023-06-25 ENCOUNTER — Other Ambulatory Visit: Payer: Self-pay

## 2023-06-25 ENCOUNTER — Encounter (HOSPITAL_COMMUNITY): Payer: Self-pay

## 2023-06-25 ENCOUNTER — Emergency Department (HOSPITAL_COMMUNITY): Payer: Medicare Other

## 2023-06-25 ENCOUNTER — Observation Stay (HOSPITAL_COMMUNITY)
Admission: EM | Admit: 2023-06-25 | Discharge: 2023-06-26 | Disposition: A | Discharge status: Home / Self Care | Payer: Medicare Other | Attending: Family Medicine | Admitting: Family Medicine

## 2023-06-25 DIAGNOSIS — R2681 Unsteadiness on feet: Secondary | ICD-10-CM | POA: Diagnosis not present

## 2023-06-25 DIAGNOSIS — Z7982 Long term (current) use of aspirin: Secondary | ICD-10-CM | POA: Diagnosis not present

## 2023-06-25 DIAGNOSIS — E119 Type 2 diabetes mellitus without complications: Secondary | ICD-10-CM | POA: Diagnosis not present

## 2023-06-25 DIAGNOSIS — E785 Hyperlipidemia, unspecified: Secondary | ICD-10-CM

## 2023-06-25 DIAGNOSIS — Z79899 Other long term (current) drug therapy: Secondary | ICD-10-CM | POA: Diagnosis not present

## 2023-06-25 DIAGNOSIS — R2689 Other abnormalities of gait and mobility: Secondary | ICD-10-CM | POA: Insufficient documentation

## 2023-06-25 DIAGNOSIS — Z7984 Long term (current) use of oral hypoglycemic drugs: Secondary | ICD-10-CM | POA: Diagnosis not present

## 2023-06-25 DIAGNOSIS — I160 Hypertensive urgency: Secondary | ICD-10-CM | POA: Insufficient documentation

## 2023-06-25 DIAGNOSIS — I10 Essential (primary) hypertension: Secondary | ICD-10-CM | POA: Diagnosis not present

## 2023-06-25 DIAGNOSIS — M6281 Muscle weakness (generalized): Secondary | ICD-10-CM | POA: Insufficient documentation

## 2023-06-25 DIAGNOSIS — R4781 Slurred speech: Secondary | ICD-10-CM | POA: Diagnosis present

## 2023-06-25 DIAGNOSIS — G459 Transient cerebral ischemic attack, unspecified: Principal | ICD-10-CM | POA: Insufficient documentation

## 2023-06-25 DIAGNOSIS — K219 Gastro-esophageal reflux disease without esophagitis: Secondary | ICD-10-CM | POA: Diagnosis not present

## 2023-06-25 LAB — COMPREHENSIVE METABOLIC PANEL
ALT: 17 U/L (ref 0–44)
AST: 20 U/L (ref 15–41)
Albumin: 4 g/dL (ref 3.5–5.0)
Alkaline Phosphatase: 52 U/L (ref 38–126)
Anion gap: 9 (ref 5–15)
BUN: 24 mg/dL — ABNORMAL HIGH (ref 8–23)
CO2: 25 mmol/L (ref 22–32)
Calcium: 10 mg/dL (ref 8.9–10.3)
Chloride: 103 mmol/L (ref 98–111)
Creatinine, Ser: 1.49 mg/dL — ABNORMAL HIGH (ref 0.44–1.00)
GFR, Estimated: 34 mL/min — ABNORMAL LOW (ref >60)
Glucose, Bld: 166 mg/dL — ABNORMAL HIGH (ref 70–99)
Potassium: 4 mmol/L (ref 3.5–5.1)
Sodium: 137 mmol/L (ref 135–145)
Total Bilirubin: 0.5 mg/dL (ref <1.2)
Total Protein: 6.9 g/dL (ref 6.5–8.1)

## 2023-06-25 LAB — DIFFERENTIAL
Abs Immature Granulocytes: 0.03 10*3/uL (ref 0.00–0.07)
Basophils Absolute: 0.1 10*3/uL (ref 0.0–0.1)
Basophils Relative: 1 %
Eosinophils Absolute: 0.2 10*3/uL (ref 0.0–0.5)
Eosinophils Relative: 2 %
Immature Granulocytes: 0 %
Lymphocytes Relative: 29 %
Lymphs Abs: 3.1 10*3/uL (ref 0.7–4.0)
Monocytes Absolute: 0.9 10*3/uL (ref 0.1–1.0)
Monocytes Relative: 8 %
Neutro Abs: 6.4 10*3/uL (ref 1.7–7.7)
Neutrophils Relative %: 60 %

## 2023-06-25 LAB — URINALYSIS, ROUTINE W REFLEX MICROSCOPIC
Bacteria, UA: NONE SEEN
Bilirubin Urine: NEGATIVE
Glucose, UA: NEGATIVE mg/dL
Hgb urine dipstick: NEGATIVE
Ketones, ur: NEGATIVE mg/dL
Leukocytes,Ua: NEGATIVE
Nitrite: NEGATIVE
Protein, ur: 100 mg/dL — AB
Specific Gravity, Urine: 1.016 (ref 1.005–1.030)
pH: 5 (ref 5.0–8.0)

## 2023-06-25 LAB — CBC
HCT: 36.2 % (ref 36.0–46.0)
Hemoglobin: 11.6 g/dL — ABNORMAL LOW (ref 12.0–15.0)
MCH: 28.6 pg (ref 26.0–34.0)
MCHC: 32 g/dL (ref 30.0–36.0)
MCV: 89.4 fL (ref 80.0–100.0)
Platelets: 283 10*3/uL (ref 150–400)
RBC: 4.05 MIL/uL (ref 3.87–5.11)
RDW: 14.5 % (ref 11.5–15.5)
WBC: 10.7 10*3/uL — ABNORMAL HIGH (ref 4.0–10.5)
nRBC: 0 % (ref 0.0–0.2)

## 2023-06-25 LAB — GLUCOSE, CAPILLARY: Glucose-Capillary: 110 mg/dL — ABNORMAL HIGH (ref 70–99)

## 2023-06-25 LAB — RAPID URINE DRUG SCREEN, HOSP PERFORMED
Amphetamines: NOT DETECTED
Barbiturates: NOT DETECTED
Benzodiazepines: NOT DETECTED
Cocaine: NOT DETECTED
Opiates: NOT DETECTED
Tetrahydrocannabinol: NOT DETECTED

## 2023-06-25 LAB — PROTIME-INR
INR: 1.1 (ref 0.8–1.2)
Prothrombin Time: 14 s (ref 11.4–15.2)

## 2023-06-25 LAB — CBG MONITORING, ED: Glucose-Capillary: 156 mg/dL — ABNORMAL HIGH (ref 70–99)

## 2023-06-25 LAB — APTT: aPTT: 29 s (ref 24–36)

## 2023-06-25 LAB — ETHANOL: Alcohol, Ethyl (B): <10 mg/dL (ref <10)

## 2023-06-25 MED ORDER — ALBUTEROL SULFATE (2.5 MG/3ML) 0.083% IN NEBU: 2.5000 mg | INHALATION_SOLUTION | Freq: Four times a day (QID) | RESPIRATORY_TRACT | Status: DC | PRN

## 2023-06-25 MED ORDER — SODIUM CHLORIDE 0.9 % IV SOLN: INTRAVENOUS | Status: DC

## 2023-06-25 MED ORDER — VITAMIN C 500 MG PO TABS
500.0000 mg | ORAL_TABLET | Freq: Every day | ORAL | Status: DC
  Administered 2023-06-25 – 2023-06-26 (×2): 500 mg via ORAL
  Filled 2023-06-25 (×2): qty 1

## 2023-06-25 MED ORDER — VITAMIN B-12 100 MCG PO TABS
500.0000 ug | ORAL_TABLET | Freq: Every day | ORAL | Status: DC
  Administered 2023-06-25 – 2023-06-26 (×2): 500 ug via ORAL
  Filled 2023-06-25 (×2): qty 5

## 2023-06-25 MED ORDER — ALBUTEROL SULFATE HFA 108 (90 BASE) MCG/ACT IN AERS: 2.0000 | INHALATION_SPRAY | RESPIRATORY_TRACT | Status: DC | PRN

## 2023-06-25 MED ORDER — ACETAMINOPHEN 325 MG PO TABS
650.0000 mg | ORAL_TABLET | Freq: Four times a day (QID) | ORAL | Status: DC | PRN
  Filled 2023-06-25: qty 2

## 2023-06-25 MED ORDER — CLOPIDOGREL BISULFATE 75 MG PO TABS
600.0000 mg | ORAL_TABLET | Freq: Once | ORAL | Status: AC
  Administered 2023-06-25: 600 mg via ORAL
  Filled 2023-06-25: qty 8

## 2023-06-25 MED ORDER — STROKE: EARLY STAGES OF RECOVERY BOOK
Freq: Once | Status: AC
  Filled 2023-06-25: qty 1

## 2023-06-25 MED ORDER — CARVEDILOL 3.125 MG PO TABS
6.2500 mg | ORAL_TABLET | Freq: Two times a day (BID) | ORAL | Status: DC
  Administered 2023-06-26: 6.25 mg via ORAL
  Filled 2023-06-25: qty 2

## 2023-06-25 MED ORDER — ENOXAPARIN SODIUM 30 MG/0.3ML IJ SOSY
30.0000 mg | PREFILLED_SYRINGE | INTRAMUSCULAR | Status: DC
  Administered 2023-06-26: 30 mg via SUBCUTANEOUS
  Filled 2023-06-25: qty 0.3

## 2023-06-25 MED ORDER — TRAZODONE HCL 50 MG PO TABS
25.0000 mg | ORAL_TABLET | Freq: Every evening | ORAL | Status: DC | PRN
  Administered 2023-06-25: 25 mg via ORAL
  Filled 2023-06-25 (×2): qty 1

## 2023-06-25 MED ORDER — OMEGA-3-ACID ETHYL ESTERS 1 G PO CAPS
2.0000 g | ORAL_CAPSULE | Freq: Every day | ORAL | Status: DC
  Administered 2023-06-26: 2 g via ORAL
  Filled 2023-06-25: qty 2

## 2023-06-25 MED ORDER — ONDANSETRON HCL 4 MG/2ML IJ SOLN: 4.0000 mg | Freq: Four times a day (QID) | INTRAMUSCULAR | Status: DC | PRN

## 2023-06-25 MED ORDER — SIMVASTATIN 20 MG PO TABS
20.0000 mg | ORAL_TABLET | Freq: Every day | ORAL | Status: DC
  Administered 2023-06-25: 20 mg via ORAL
  Filled 2023-06-25: qty 1

## 2023-06-25 MED ORDER — MAGNESIUM HYDROXIDE 400 MG/5ML PO SUSP
30.0000 mL | Freq: Every day | ORAL | Status: DC | PRN
  Filled 2023-06-25: qty 30

## 2023-06-25 MED ORDER — ACETAMINOPHEN 650 MG RE SUPP: 650.0000 mg | Freq: Four times a day (QID) | RECTAL | Status: DC | PRN

## 2023-06-25 MED ORDER — ASPIRIN 81 MG PO TBEC
81.0000 mg | DELAYED_RELEASE_TABLET | Freq: Every day | ORAL | Status: DC
Start: 2023-06-25 — End: 2023-06-26
  Administered 2023-06-25 – 2023-06-26 (×2): 81 mg via ORAL
  Filled 2023-06-25 (×2): qty 1

## 2023-06-25 MED ORDER — PANTOPRAZOLE SODIUM 40 MG PO TBEC
40.0000 mg | DELAYED_RELEASE_TABLET | Freq: Every day | ORAL | Status: DC
  Administered 2023-06-25 – 2023-06-26 (×2): 40 mg via ORAL
  Filled 2023-06-25 (×2): qty 1

## 2023-06-25 MED ORDER — ONDANSETRON HCL 4 MG PO TABS: 4.0000 mg | ORAL_TABLET | Freq: Four times a day (QID) | ORAL | Status: DC | PRN

## 2023-06-25 MED ORDER — LOSARTAN POTASSIUM 50 MG PO TABS
50.0000 mg | ORAL_TABLET | Freq: Every day | ORAL | Status: DC
  Administered 2023-06-25 – 2023-06-26 (×2): 50 mg via ORAL
  Filled 2023-06-25 (×2): qty 1

## 2023-06-25 NOTE — Progress Notes (Addendum)
1956 - Code Stroke Activated   1958 - Patient in CT   mRS 1, Patient was at church had a sudden onset of R dense hemiparesis, slurred speech, emesis, confusion, and unable to follow commands. Upon arrival to the ED most of patient's symptoms have resolved. LKWT 1610  2003 - Tele-Neurologist paged  2006 - patient returned to room from CT  2008 - Dr. Donella Stade joined stroke cart   2025 - CT head results given to Dr. Donella Stade

## 2023-06-25 NOTE — ED Notes (Signed)
ED TO INPATIENT HANDOFF REPORT  ED Nurse Name and Phone #: Nelly Laurence Name/Age/Gender [name] 89 y.o. female Room/Bed: APA18/APA18  Code Status   Code Status: Full Code  Home/SNF/Other Home Patient oriented to: self, place, time, and situation Is this baseline? Yes   Triage Complete: Triage complete  Chief Complaint TIA (transient ischemic attack) [G45.9]  Triage Note Pt arrived via [facility] from church when Pt was reported to have develop slurred speech, right side paralysis, emesis. When EMS arrived, Pt was confused and unable to follow commands. Pt symptoms have begun resolving during transport to [facility]. Hx of CVA. EDP present on arrival and called Code stroke.    Allergies No Known Allergies  Level of Care/Admitting Diagnosis ED Disposition     ED Disposition  Admit   Condition  --   Comment  Hospital Area: [facility] [[id number]]  Level of Care: Telemetry [5]  Covid Evaluation: Asymptomatic - no recent exposure (last 10 days) testing not required  Diagnosis: TIA (transient ischemic attack) [696295]  Admitting Physician: Hannah Beat [[id number]]  Attending Physician: Hannah Beat [[id number]]          B Medical/Surgery History Past Medical History:  Diagnosis Date   Anemia    Diabetes mellitus without complication (HCC)    Difficulty sleeping    GERD (gastroesophageal reflux disease)    Hyperlipidemia    Hypertension    Sciatic pain    Shortness of breath dyspnea    with exertion   Spinal stenosis    Past Surgical History:  Procedure Laterality Date   ABDOMINAL HYSTERECTOMY  1980's   LUMBAR LAMINECTOMY/DECOMPRESSION MICRODISCECTOMY N/A 05/14/2015   Procedure: MICRO LUMBAR DECOMPRESSION L4-L5 POSSIBLE L3-L4 ( 2 LEVELS);  Surgeon: Jene Every, MD;  Location: [facility];  Service: Orthopedics;  Laterality: N/A;     A IV Location/Drains/Wounds Patient Lines/Drains/Airways Status     Active Line/Drains/Airways     Name Placement date Placement  time Site Days   Peripheral IV 06/25/23 16 G Left Antecubital 06/25/23  2003  Antecubital  less than 1            Intake/Output Last 24 hours No intake or output data in the 24 hours ending 06/25/23 2238  Labs/Imaging Results for orders placed or performed during the hospital encounter of 06/25/23 (from the past 48 hour(s))  Ethanol     Status: None   Collection Time: 06/25/23  5:30 PM  Result Value Ref Range   Alcohol, Ethyl (B) <10 <10 mg/dL    Comment: (NOTE) Lowest detectable limit for serum alcohol is 10 mg/dL.  For medical purposes only. Performed at [facility], [address], [place], [place] [address]   Protime-INR     Status: None   Collection Time: 06/25/23  7:30 PM  Result Value Ref Range   Prothrombin Time 14.0 11.4 - 15.2 seconds   INR 1.1 0.8 - 1.2    Comment: (NOTE) INR goal varies based on device and disease states. Performed at [facility], [address], [place], [place] [address]   APTT     Status: None   Collection Time: 06/25/23  7:30 PM  Result Value Ref Range   aPTT 29 24 - 36 seconds    Comment: Performed at [facility], [address] [place], [place] [address]  CBC     Status: Abnormal   Collection Time: 06/25/23  7:30 PM  Result Value Ref Range   WBC 10.7 (H) 4.0 -  10.5 K/uL   RBC 4.05 3.87 - 5.11 MIL/uL   Hemoglobin 11.6 (L) 12.0 - 15.0 g/dL   HCT 21.3 08.6 - 57.8 %   MCV 89.4 80.0 - 100.0 fL   MCH 28.6 26.0 - 34.0 pg   MCHC 32.0 30.0 - 36.0 g/dL   RDW 46.9 62.9 - 52.8 %   Platelets 283 150 - 400 K/uL   nRBC 0.0 0.0 - 0.2 %    Comment: Performed at [facility], [address], [place], [place] [address]  Differential     Status: None   Collection Time: 06/25/23  7:30 PM  Result Value Ref Range   Neutrophils Relative % 60 %   Neutro Abs 6.4 1.7 - 7.7 K/uL   Lymphocytes Relative 29 %   Lymphs Abs 3.1 0.7 - 4.0 K/uL   Monocytes Relative 8 %   Monocytes Absolute 0.9 0.1 - 1.0 K/uL   Eosinophils Relative 2 %   Eosinophils  Absolute 0.2 0.0 - 0.5 K/uL   Basophils Relative 1 %   Basophils Absolute 0.1 0.0 - 0.1 K/uL   Immature Granulocytes 0 %   Abs Immature Granulocytes 0.03 0.00 - 0.07 K/uL    Comment: Performed at [facility], [address] [place], [place] [address]  Comprehensive metabolic panel     Status: Abnormal   Collection Time: 06/25/23  7:30 PM  Result Value Ref Range   Sodium 137 135 - 145 mmol/L   Potassium 4.0 3.5 - 5.1 mmol/L   Chloride 103 98 - 111 mmol/L   CO2 25 22 - 32 mmol/L   Glucose, Bld 166 (H) 70 - 99 mg/dL    Comment: Glucose reference range applies only to samples taken after fasting for at least 8 hours.   BUN 24 (H) 8 - 23 mg/dL   Creatinine, Ser 7.25 (H) 0.44 - 1.00 mg/dL   Calcium 36.6 8.9 - 44.0 mg/dL   Total Protein 6.9 6.5 - 8.1 g/dL   Albumin 4.0 3.5 - 5.0 g/dL   AST 20 15 - 41 U/L   ALT 17 0 - 44 U/L   Alkaline Phosphatase 52 38 - 126 U/L   Total Bilirubin 0.5 <1.2 mg/dL   GFR, Estimated 34 (L) >60 mL/min    Comment: (NOTE) Calculated using the CKD-EPI Creatinine Equation (2021)    Anion gap 9 5 - 15    Comment: Performed at [facility], [address], [place], [place] [address]  CBG monitoring, ED     Status: Abnormal   Collection Time: 06/25/23  8:08 PM  Result Value Ref Range   Glucose-Capillary 156 (H) 70 - 99 mg/dL    Comment: Glucose reference range applies only to samples taken after fasting for at least 8 hours.  Urine rapid drug screen (hosp performed)     Status: None   Collection Time: 06/25/23  8:44 PM  Result Value Ref Range   Opiates NONE DETECTED NONE DETECTED   Cocaine NONE DETECTED NONE DETECTED   Benzodiazepines NONE DETECTED NONE DETECTED   Amphetamines NONE DETECTED NONE DETECTED   Tetrahydrocannabinol NONE DETECTED NONE DETECTED   Barbiturates NONE DETECTED NONE DETECTED    Comment: (NOTE) DRUG SCREEN FOR MEDICAL PURPOSES ONLY.  IF CONFIRMATION IS NEEDED FOR ANY PURPOSE, NOTIFY LAB WITHIN 5 DAYS.  LOWEST DETECTABLE LIMITS FOR  URINE DRUG SCREEN Drug Class                     Cutoff (ng/mL) Amphetamine  and metabolites    1000 Barbiturate and metabolites    200 Benzodiazepine                 200 Opiates and metabolites        300 Cocaine and metabolites        300 THC                            50 Performed at [facility], [address] [place], [place] [address]   Urinalysis, Routine w reflex microscopic -Urine, Clean Catch     Status: Abnormal   Collection Time: 06/25/23  8:44 PM  Result Value Ref Range   Color, Urine YELLOW YELLOW   APPearance CLEAR CLEAR   Specific Gravity, Urine 1.016 1.005 - 1.030   pH 5.0 5.0 - 8.0   Glucose, UA NEGATIVE NEGATIVE mg/dL   Hgb urine dipstick NEGATIVE NEGATIVE   Bilirubin Urine NEGATIVE NEGATIVE   Ketones, ur NEGATIVE NEGATIVE mg/dL   Protein, ur 244 (A) NEGATIVE mg/dL   Nitrite NEGATIVE NEGATIVE   Leukocytes,Ua NEGATIVE NEGATIVE   RBC / HPF 0-5 0 - 5 RBC/hpf   WBC, UA 0-5 0 - 5 WBC/hpf   Bacteria, UA NONE SEEN NONE SEEN   Squamous Epithelial / HPF 0-5 0 - 5 /HPF   Hyaline Casts, UA PRESENT     Comment: Performed at [facility], [address], [place], [place] [address]   CT HEAD CODE STROKE WO CONTRAST  Result Date: 06/25/2023 CLINICAL DATA:  Code stroke. Slurred speech with right-sided paralysis EXAM: CT HEAD WITHOUT CONTRAST TECHNIQUE: Contiguous axial images were obtained from the base of the skull through the vertex without intravenous contrast. RADIATION DOSE REDUCTION: This exam was performed according to the departmental dose-optimization program which includes automated exposure control, adjustment of the mA and/or kV according to patient size and/or use of iterative reconstruction technique. COMPARISON:  None Available. FINDINGS: Brain: There is no mass, hemorrhage or extra-axial collection. There is generalized atrophy without lobar predilection. There is hypoattenuation of the periventricular white matter, most commonly indicating chronic ischemic  microangiopathy. Vascular: No abnormal hyperdensity of the major intracranial arteries or dural venous sinuses. No intracranial atherosclerosis. Skull: The visualized skull base, calvarium and extracranial soft tissues are normal. Sinuses/Orbits: No fluid levels or advanced mucosal thickening of the visualized paranasal sinuses. No mastoid or middle ear effusion. The orbits are normal. ASPECTS [facility] Stroke Program Early CT Score) - Ganglionic level infarction (caudate, lentiform nuclei, internal capsule, insula, M1-M3 cortex): 7 - Supraganglionic infarction (M4-M6 cortex): 3 Total score (0-10 with 10 being normal): 10 IMPRESSION: 1. No acute intracranial abnormality. 2. ASPECTS is 10. 3. Chronic ischemic microangiopathy and generalized atrophy. These results were called by telephone at the time of interpretation on 06/25/2023 at 8:25 pm to provider [facility] ZAMMIT , who verbally acknowledged these results. Electronically Signed   By: Deatra Robinson M.D.   On: 06/25/2023 20:25    Pending Labs Unresulted Labs (From admission, onward)     Start     Ordered   06/26/23 0500  Lipid panel  (Labs)  Tomorrow morning,   R       Comments: Fasting    06/25/23 2159   06/26/23 0500  Basic metabolic panel  Tomorrow morning,   R        06/25/23 2159   06/26/23 0500  CBC  Tomorrow morning,   R  06/25/23 2159            Vitals/Pain Today's Vitals   06/25/23 2030 06/25/23 2040 06/25/23 2047 06/25/23 2130  BP:   (!) 181/92 (!) 176/77  Pulse: 80  82   Resp: 19  19 (!) 21  Temp:  98 F (36.7 C)    TempSrc:  Oral    SpO2: 95%  96% 96%  Weight:      Height:      PainSc:        Isolation Precautions No active isolations  Medications Medications  aspirin EC tablet 81 mg (has no administration in time range)  carvedilol (COREG) tablet 6.25 mg (has no administration in time range)  losartan (COZAAR) tablet 50 mg (has no administration in time range)  simvastatin (ZOCOR) tablet 20 mg (has no  administration in time range)  pantoprazole (PROTONIX) EC tablet 40 mg (has no administration in time range)  cyanocobalamin (VITAMIN B12) tablet 500 mcg (has no administration in time range)  ascorbic acid (VITAMIN C) tablet 500 mg (has no administration in time range)  omega-3 acid ethyl esters (LOVAZA) capsule 2 g (has no administration in time range)   stroke: early stages of recovery book (has no administration in time range)  enoxaparin (LOVENOX) injection 30 mg (has no administration in time range)  0.9 %  sodium chloride infusion (has no administration in time range)  acetaminophen (TYLENOL) tablet 650 mg (has no administration in time range)    Or  acetaminophen (TYLENOL) suppository 650 mg (has no administration in time range)  traZODone (DESYREL) tablet 25 mg (has no administration in time range)  magnesium hydroxide (MILK OF MAGNESIA) suspension 30 mL (has no administration in time range)  ondansetron (ZOFRAN) tablet 4 mg (has no administration in time range)    Or  ondansetron (ZOFRAN) injection 4 mg (has no administration in time range)  albuterol (PROVENTIL) (2.5 MG/3ML) 0.083% nebulizer solution 2.5 mg (has no administration in time range)  clopidogrel (PLAVIX) tablet 600 mg (600 mg Oral Given 06/25/23 2050)    Mobility walks     Focused Assessments See chart   R Recommendations: See Admitting Provider Note  Report given to:   Additional Notes: see chart

## 2023-06-25 NOTE — ED Provider Notes (Signed)
[name] EMERGENCY DEPARTMENT AT [facility] Provider Note   CSN: [id number] Arrival date & time: 06/25/23  1954     History {Add pertinent medical, surgical, social history, OB history to HPI:1} Chief Complaint  Patient presents with   Code Stroke    [name] is a 89 y.o. female.  Patient was at church tonight and at 515 she started to get confused having slurred speech and had weakness on the right side.  Patient has a history of hypertension diabetes and has had a TIA before   Weakness      Home Medications Prior to Admission medications   Medication Sig Start Date End Date Taking? Authorizing Provider  albuterol (VENTOLIN HFA) 108 (90 Base) MCG/ACT inhaler Inhale 2 puffs into the lungs as needed for wheezing or shortness of breath. 02/01/23   [provider]  Ascorbic Acid (VITAMIN C PO) Take 1 tablet by mouth daily.    [provider]  aspirin EC 81 MG tablet Take 81 mg by mouth daily.    [provider]  carvedilol (COREG) 6.25 MG tablet Take 6.25 mg by mouth in the morning and at bedtime. 03/04/23   [provider]  Cyanocobalamin (VITAMIN B-12 PO) Take 1 capsule by mouth daily.    [provider]  losartan (COZAAR) 50 MG tablet Take 50 mg by mouth daily. 02/17/23   [provider]  metFORMIN (GLUCOPHAGE) 500 MG tablet Take 500 mg by mouth in the morning and at bedtime.    [provider]  Multiple Vitamin (MULTIVITAMIN WITH MINERALS) TABS tablet Take 1 tablet by mouth daily.    [provider]  Omega-3 Fatty Acids (FISH OIL PO) Take 2 capsules by mouth daily.    [provider]  pantoprazole (PROTONIX) 40 MG tablet Take 1 tablet (40 mg total) by mouth daily. 04/11/23 05/11/23  Berton Mount I, MD  simvastatin (ZOCOR) 20 MG tablet Take 20 mg by mouth at bedtime. 02/06/15   [provider]      Allergies    Patient has no known allergies.    Review of Systems    Review of Systems  Neurological:  Positive for weakness.    Physical Exam Updated Vital Signs BP (!) 181/92   Pulse 82   Temp 98 F (36.7 C) (Oral)   Resp 19   Ht 5\' 4"  (1.626 m)   Wt 91.3 kg   SpO2 96%   BMI 34.55 kg/m  Physical Exam  ED Results / Procedures / Treatments   Labs (all labs ordered are listed, but only abnormal results are displayed) Labs Reviewed  CBC - Abnormal; Notable for the following components:      Result Value   WBC 10.7 (*)    Hemoglobin 11.6 (*)    All other components within normal limits  COMPREHENSIVE METABOLIC PANEL - Abnormal; Notable for the following components:   Glucose, Bld 166 (*)    BUN 24 (*)    Creatinine, Ser 1.49 (*)    GFR, Estimated 34 (*)    All other components within normal limits  URINALYSIS, ROUTINE W REFLEX MICROSCOPIC - Abnormal; Notable for the following components:   Protein, ur 100 (*)    All other components within normal limits  CBG MONITORING, ED - Abnormal; Notable for the following components:   Glucose-Capillary 156 (*)    All other components within normal limits  ETHANOL  PROTIME-INR  APTT  DIFFERENTIAL  RAPID URINE DRUG SCREEN,  HOSP PERFORMED  I-STAT CHEM 8, ED    EKG EKG Interpretation Date/Time:  Saturday June 25 2023 20:22:48 EST Ventricular Rate:  84 PR Interval:  186 QRS Duration:  84 QT Interval:  388 QTC Calculation: 459 R Axis:   49  Text Interpretation: Sinus rhythm Ventricular premature complex Probable left atrial enlargement Confirmed by Bethann Berkshire [phone]) on 06/25/2023 9:07:56 PM  Radiology CT HEAD CODE STROKE WO CONTRAST  Result Date: 06/25/2023 CLINICAL DATA:  Code stroke. Slurred speech with right-sided paralysis EXAM: CT HEAD WITHOUT CONTRAST TECHNIQUE: Contiguous axial images were obtained from the base of the skull through the vertex without intravenous contrast. RADIATION DOSE REDUCTION: This exam was performed according to the departmental dose-optimization  program which includes automated exposure control, adjustment of the mA and/or kV according to patient size and/or use of iterative reconstruction technique. COMPARISON:  None Available. FINDINGS: Brain: There is no mass, hemorrhage or extra-axial collection. There is generalized atrophy without lobar predilection. There is hypoattenuation of the periventricular white matter, most commonly indicating chronic ischemic microangiopathy. Vascular: No abnormal hyperdensity of the major intracranial arteries or dural venous sinuses. No intracranial atherosclerosis. Skull: The visualized skull base, calvarium and extracranial soft tissues are normal. Sinuses/Orbits: No fluid levels or advanced mucosal thickening of the visualized paranasal sinuses. No mastoid or middle ear effusion. The orbits are normal. ASPECTS [facility] Stroke Program Early CT Score) - Ganglionic level infarction (caudate, lentiform nuclei, internal capsule, insula, M1-M3 cortex): 7 - Supraganglionic infarction (M4-M6 cortex): 3 Total score (0-10 with 10 being normal): 10 IMPRESSION: 1. No acute intracranial abnormality. 2. ASPECTS is 10. 3. Chronic ischemic microangiopathy and generalized atrophy. These results were called by telephone at the time of interpretation on 06/25/2023 at 8:25 pm to provider [facility] Record , who verbally acknowledged these results. Electronically Signed   By: Deatra Robinson M.D.   On: 06/25/2023 20:25    Procedures Procedures  {Document cardiac monitor, telemetry assessment procedure when appropriate:1}  Medications Ordered in ED Medications  clopidogrel (PLAVIX) tablet 600 mg (600 mg Oral Given 06/25/23 2050)    ED Course/ Medical Decision Making/ A&P   {  CRITICAL CARE Performed by: Bethann Berkshire Total critical care time: 45 minutes Critical care time was exclusive of separately billable procedures and treating other patients. Critical care was necessary to treat or prevent imminent or life-threatening  deterioration. Critical care was time spent personally by me on the following activities: development of treatment plan with patient and/or surrogate as well as nursing, discussions with consultants, evaluation of patient's response to treatment, examination of patient, obtaining history from patient or surrogate, ordering and performing treatments and interventions, ordering and review of laboratory studies, ordering and review of radiographic studies, pulse oximetry and re-evaluation of patient's condition.   Patient was seen by neurology and they recommended putting the patient on Plavix and get an MRI in the morning.  Neurology did not feel like a CT angio was necessary since she had one recently.  She was admitted recently and had a full TIA workup Click here for ABCD2, HEART and other calculatorsREFRESH Note before signing :1}                              Medical Decision Making Amount and/or Complexity of Data Reviewed Labs: ordered. Radiology: ordered.  Risk Prescription drug management. Decision regarding hospitalization.   Patient with a TIA.  She will get an MRI tomorrow and is started  on Plavix  {Document critical care time when appropriate:1} {Document review of labs and clinical decision tools ie heart score, Chads2Vasc2 etc:1}  {Document your independent review of radiology images, and any outside records:1} {Document your discussion with family members, caretakers, and with consultants:1} {Document social determinants of health affecting pt's care:1} {Document your decision making why or why not admission, treatments were needed:1} Final Clinical Impression(s) / ED Diagnoses Final diagnoses:  TIA (transient ischemic attack)    Rx / DC Orders ED Discharge Orders     None

## 2023-06-25 NOTE — Consult Note (Signed)
[facility] Consult Services   Patient Name:   [name], [name] Date of Birth:   1934-04-06 Identification Number:   MRN - [id number] Date of Service:   06/25/2023 20:03:15  Diagnosis:       G45.9 - Transient cerebral ischemic attack, unspecified  Impression:      89 year old female with history of TIA, left MCA branch stenosis presenting with likely recurrent TIA vs mild stroke resulting in transient right sided weakness and aphasia, now resolved.  Patient is not a candidate for emergent stroke reperfusion therapy (i.e. IV thrombolysis or endovascular thrombectomy) due to having resolution of symptoms.  Will resume clopidogrel and recommend continuing dual antiplatelet therapy for 90 days per SAMMPRIS recommendations in view of likely symptomatic severe intracranial stenosis.  Our recommendations are outlined below.  Recommendations:        Stroke/Telemetry Floor       Neuro Checks       Bedside Swallow Eval       DVT Prophylaxis       IV Fluids, Normal Saline       Head of Bed 30 Degrees       Euglycemia and Avoid Hyperthermia (PRN Acetaminophen)       Antihypertensives PRN if Blood pressure is greater than 220/120 or there is a concern for End organ damage/contraindications for permissive HTN. If blood pressure is greater than 220/120 give labetalol PO or IV or Vasotec IV with a goal of 15% reduction in BP during the first 24 hours.       Give clopidogrel 600 mg + aspirin 162 mg x 1 now per POINT trial recommendation, with plan to continue dual antiplatelet therapy with clopidogrel 75 mg + aspirin 81 mg chewable daily x 90 days per SAMMPRIS trial recommendations in view of symptomatic severe left M2 stenosis seen on CTA from 2 months ago, then deescalate to monotherapy thereafter, unless indicated for other regimen based on pending stroke workup - defer final long-term antithrombotic recommendations to inpatient team.       Recommend admit for MRI brain  without contrast; further etiologic workup to be determined by inpatient team based on MRI result. Please note that patient completed CTA head/neck and Echo during last admission on 04/08/2023.  Sign Out:       Discussed with Emergency Department Provider    ------------------------------------------------------------------------------  Advanced Imaging: Advanced Imaging Deferred because:  Non-disabling symptoms as verified by the patient; no cortical signs so not consistent with LVO   Metrics: Last Known Well: 06/25/2023 19:15:00 Dispatch Time: 06/25/2023 20:03:15 Arrival Time: 06/25/2023 19:54:00 Initial Response Time: 06/25/2023 20:08:37 Symptoms: Transient right sided paralysis and confusion. Initial patient interaction: 06/25/2023 20:12:05 NIHSS Assessment Completed: 06/25/2023 20:20:00 Patient is not a candidate for Thrombolytic. Thrombolytic Medical Decision: 06/25/2023 20:20:00 Patient was not deemed candidate for Thrombolytic because of following reasons: Resolved symptoms .  I personally reviewed the CT Head and it showed no acute infarct or hemorrhage.  Primary Provider Notified of Diagnostic Impression and Management Plan on: 06/25/2023 20:30:47    ------------------------------------------------------------------------------  History of Present Illness: Patient is a 89 year old Female.  Patient was brought by EMS for symptoms of Transient right sided paralysis and confusion. 89 year old female was at church when she suddenly had right sided weakness and aphasia. Patient's symptoms have almost completely resolved; she still has very occasional word-finding difficulties but states she is otherwise pretty much back to normal. Patient had a similar event a little over two months  ago diagnosed as TIA (MRI ruled out stroke), also found to have severe left MCA branch stenosis, however was only continued on DAPT for 21 days at that time.   Past Medical History:       Hypertension      Diabetes Mellitus      Hyperlipidemia      There is no history of Coronary Artery Disease      There is no history of Stroke  Medications:  No Anticoagulant use  Antiplatelet use: Yes aspirin 81 mg Reviewed EMR for current medications  Allergies:  Reviewed  Social History: Smoking: Former  Family History:  There is no family history of premature cerebrovascular disease pertinent to this consultation  ROS : 14 Points Review of Systems was performed and was negative except mentioned in HPI.  Past Surgical History: There Is No Surgical History Contributory To Today's Visit    Examination: BP(179/73), Pulse(87), Blood Glucose(156) 1A: Level of Consciousness - Alert; keenly responsive + 0 1B: Ask Month and Age - Both Questions Right + 0 1C: Blink Eyes & Squeeze Hands - Performs Both Tasks + 0 2: Test Horizontal Extraocular Movements - Normal + 0 3: Test Visual Fields - No Visual Loss + 0 4: Test Facial Palsy (Use Grimace if Obtunded) - Normal symmetry + 0 5A: Test Left Arm Motor Drift - No Drift for 10 Seconds + 0 5B: Test Right Arm Motor Drift - No Drift for 10 Seconds + 0 6A: Test Left Leg Motor Drift - No Drift for 5 Seconds + 0 6B: Test Right Leg Motor Drift - No Drift for 5 Seconds + 0 7: Test Limb Ataxia (FNF/Heel-Shin) - No Ataxia + 0 8: Test Sensation - Normal; No sensory loss + 0 9: Test Language/Aphasia - Mild-Moderate Aphasia: Some Obvious Changes, Without Significant Limitation + 1 10: Test Dysarthria - Normal + 0 11: Test Extinction/Inattention - No abnormality + 0  NIHSS Score: 1  NIHSS Free Text : Very occasional word-finding difficulties, nondisabling. Patient endorses that she does not read well at baseline.  Pre-Morbid Modified Rankin Scale: 0 Points = No symptoms at all  Spoke with : Dr. Estell Harpin  This consult was conducted in real time using interactive audio and Immunologist. Patient was informed of the technology being used  for this visit and agreed to proceed. Patient located in hospital and provider located at home/office setting.   Patient is being evaluated for possible acute neurologic impairment and high probability of imminent or life-threatening deterioration. I spent total of 35 minutes providing care to this patient, including time for face to face visit via telemedicine, review of medical records, imaging studies and discussion of findings with providers, the patient and/or family.   Dr Meta Hatchet   [facility] For Inpatient follow-up with [facility] physician please call [facility] at [phone]. As we are not an outpatient service for any post hospital discharge needs please contact the hospital for assistance. If you have any questions for the [facility] physicians or need to reconsult for clinical or diagnostic changes please contact us via [facility] at [phone].

## 2023-06-25 NOTE — ED Triage Notes (Signed)
Pt arrived via [facility] from church when Pt was reported to have develop slurred speech, right side paralysis, emesis. When EMS arrived, Pt was confused and unable to follow commands. Pt symptoms have begun resolving during transport to [facility]. Hx of CVA. EDP present on arrival and called Code stroke.

## 2023-06-26 ENCOUNTER — Encounter (HOSPITAL_COMMUNITY): Payer: Self-pay | Admitting: Family Medicine

## 2023-06-26 ENCOUNTER — Observation Stay (HOSPITAL_COMMUNITY): Payer: Medicare Other

## 2023-06-26 DIAGNOSIS — K219 Gastro-esophageal reflux disease without esophagitis: Secondary | ICD-10-CM | POA: Insufficient documentation

## 2023-06-26 DIAGNOSIS — E119 Type 2 diabetes mellitus without complications: Secondary | ICD-10-CM

## 2023-06-26 DIAGNOSIS — G459 Transient cerebral ischemic attack, unspecified: Secondary | ICD-10-CM

## 2023-06-26 DIAGNOSIS — I160 Hypertensive urgency: Secondary | ICD-10-CM | POA: Diagnosis not present

## 2023-06-26 DIAGNOSIS — E785 Hyperlipidemia, unspecified: Secondary | ICD-10-CM | POA: Diagnosis not present

## 2023-06-26 LAB — CBC
HCT: 33.2 % — ABNORMAL LOW (ref 36.0–46.0)
Hemoglobin: 10.3 g/dL — ABNORMAL LOW (ref 12.0–15.0)
MCH: 27.7 pg (ref 26.0–34.0)
MCHC: 31 g/dL (ref 30.0–36.0)
MCV: 89.2 fL (ref 80.0–100.0)
Platelets: 247 10*3/uL (ref 150–400)
RBC: 3.72 MIL/uL — ABNORMAL LOW (ref 3.87–5.11)
RDW: 14.6 % (ref 11.5–15.5)
WBC: 7.9 10*3/uL (ref 4.0–10.5)
nRBC: 0 % (ref 0.0–0.2)

## 2023-06-26 LAB — BASIC METABOLIC PANEL
Anion gap: 9 (ref 5–15)
BUN: 26 mg/dL — ABNORMAL HIGH (ref 8–23)
CO2: 24 mmol/L (ref 22–32)
Calcium: 9.7 mg/dL (ref 8.9–10.3)
Chloride: 104 mmol/L (ref 98–111)
Creatinine, Ser: 1.45 mg/dL — ABNORMAL HIGH (ref 0.44–1.00)
GFR, Estimated: 35 mL/min — ABNORMAL LOW (ref >60)
Glucose, Bld: 201 mg/dL — ABNORMAL HIGH (ref 70–99)
Potassium: 3.6 mmol/L (ref 3.5–5.1)
Sodium: 137 mmol/L (ref 135–145)

## 2023-06-26 LAB — LIPID PANEL
Cholesterol: 129 mg/dL (ref 0–200)
HDL: 40 mg/dL — ABNORMAL LOW (ref >40)
LDL Cholesterol: 68 mg/dL (ref 0–99)
Total CHOL/HDL Ratio: 3.2 {ratio}
Triglycerides: 106 mg/dL (ref <150)
VLDL: 21 mg/dL (ref 0–40)

## 2023-06-26 LAB — GLUCOSE, CAPILLARY: Glucose-Capillary: 133 mg/dL — ABNORMAL HIGH (ref 70–99)

## 2023-06-26 MED ORDER — CLOPIDOGREL BISULFATE 75 MG PO TABS: 75.0000 mg | ORAL_TABLET | Freq: Every day | ORAL | 2 refills | Status: AC

## 2023-06-26 MED ORDER — LABETALOL HCL 5 MG/ML IV SOLN
20.0000 mg | INTRAVENOUS | Status: DC | PRN
  Administered 2023-06-26: 20 mg via INTRAVENOUS
  Filled 2023-06-26: qty 4

## 2023-06-26 NOTE — Assessment & Plan Note (Signed)
-   Permissive hypertension will be allowed. - The patient will be placed on as needed IV labetalol.

## 2023-06-26 NOTE — Assessment & Plan Note (Signed)
-   The patient will be placed on supplemental coverage with [employer]. - We will hold off metformin. 

## 2023-06-26 NOTE — Care Management Obs Status (Signed)
MEDICARE OBSERVATION STATUS NOTIFICATION   Patient Details  Name: DESTYNEE ANGERER MRN: [id number] Date of Birth: Feb 25, 1934   Medicare Observation Status Notification Given:  Yes    Finley Chevez Marsh Dolly, LCSW 06/26/2023, 3:49 PM

## 2023-06-26 NOTE — Plan of Care (Signed)

## 2023-06-26 NOTE — TOC Transition Note (Signed)
Transition of Care [facility]) - CM/SW Discharge Note   Patient Details  Name: [name] MRN: [id number] Date of Birth: [birth year]  Transition of Care (TOC) CM/SW Contact:  Catalina Gravel, LCSW Phone Number: 06/26/2023, 3:50 PM   Clinical Narrative:    CSW delivered [facility] to pt verbally at bedside. Pt agreeable to HHPT- had [facility] in the past.  CSW agreed to refer.   CSW reviewed pt insurance, plan not listed under [facility] at this time.  [facility] declined, CSW referred to [facility], who accepted patient and can visit Monday.  No further TOC needs.            Patient Goals and CMS Choice      Discharge Placement                         Discharge Plan and Services Additional resources added to the After Visit Summary for                                       Social Determinants of Health (SDOH) Interventions SDOH Screenings   Food Insecurity: No Food Insecurity (06/25/2023)  Housing: Patient Declined (06/25/2023)  Transportation Needs: No Transportation Needs (06/25/2023)  Utilities: Not At Risk (06/25/2023)  Financial Resource Strain: Low Risk  (12/14/2022)   Received from [facility]  Physical Activity: Inactive (12/14/2022)   Received from [facility]  Social Connections: Moderately Integrated (12/14/2022)   Received from [facility]  Stress: No Stress Concern Present (12/14/2022)   Received from [facility]  Tobacco Use: Low Risk  (06/26/2023)  Recent Concern: Tobacco Use - Medium Risk (06/08/2023)   Received from [facility]     Readmission Risk Interventions     No data to display

## 2023-06-26 NOTE — Discharge Instructions (Signed)
NEUROLOGY RECOMMENDS THAT  YOU TAKE PLAVIX WITH YOUR ASPIRIN FOR 90 DAYS THEN RESUME ASPIRIN ALONE    IMPORTANT INFORMATION: PAY CLOSE ATTENTION   PHYSICIAN DISCHARGE INSTRUCTIONS  Follow with Primary care provider  April Manson, NP  and other consultants as instructed by your Hospitalist Physician  SEEK MEDICAL CARE OR RETURN TO EMERGENCY ROOM IF SYMPTOMS COME BACK, WORSEN OR NEW PROBLEM DEVELOPS   Please note: You were cared for by a hospitalist during your hospital stay. Every effort will be made to forward records to your primary care provider.  You can request that your primary care provider send for your hospital records if they have not received them.  Once you are discharged, your primary care physician will handle any further medical issues. Please note that NO REFILLS for any discharge medications will be authorized once you are discharged, as it is imperative that you return to your primary care physician (or establish a relationship with a primary care physician if you do not have one) for your post hospital discharge needs so that they can reassess your need for medications and monitor your lab values.  Please get a complete blood count and chemistry panel checked by your Primary MD at your next visit, and again as instructed by your Primary MD.  Get Medicines reviewed and adjusted: Please take all your medications with you for your next visit with your Primary MD  Laboratory/radiological data: Please request your Primary MD to go over all hospital tests and procedure/radiological results at the follow up, please ask your primary care provider to get all Hospital records sent to his/her office.  In some cases, they will be blood work, cultures and biopsy results pending at the time of your discharge. Please request that your primary care provider follow up on these results.  If you are diabetic, please bring your blood sugar readings with you to your follow up appointment with  primary care.    Please call and make your follow up appointments as soon as possible.    Also Note the following: If you experience worsening of your admission symptoms, develop shortness of breath, life threatening emergency, suicidal or homicidal thoughts you must seek medical attention immediately by calling 911 or calling your MD immediately  if symptoms less severe.  You must read complete instructions/literature along with all the possible adverse reactions/side effects for all the Medicines you take and that have been prescribed to you. Take any new Medicines after you have completely understood and accpet all the possible adverse reactions/side effects.   Do not drive when taking Pain medications or sleeping medications (Benzodiazepines)  Do not take more than prescribed Pain, Sleep and Anxiety Medications. It is not advisable to combine anxiety,sleep and pain medications without talking with your primary care practitioner  Special Instructions: If you have smoked or chewed Tobacco  in the last 2 yrs please stop smoking, stop any regular Alcohol  and or any Recreational drug use.  Wear Seat belts while driving.  Do not drive if taking any narcotic, mind altering or controlled substances or recreational drugs or alcohol.

## 2023-06-26 NOTE — Evaluation (Signed)
Physical Therapy Evaluation Patient Details Name: [name] MRN: [id number] DOB: [birth year] Today's Date: 06/26/2023  History of Present Illness  [name] is a 89 y.o. female with medical history significant for diabetes mellitus, hypertension, sleep edema, spinal stenosis,, anemia, who presented to the emergency room with acute onset of slurred speech with associated altered mental status with confusion as well as right facial droop and right-sided upper and lower extremity weakness that started around 7:15 PM.  She denied any associated headache or dizziness or blurred vision.  No paresthesias or urinary or stool incontinence.  No tinnitus or vertigo.  No witnessed seizures.   Clinical Impression  Patient functioning near baseline for functional mobility and gait other than having to use RW ambulation for longer distances due to generalized weakness, otherwise demonstrates good return for completing functional activities without loss of balance.  Plan:  Patient discharged from physical therapy to care of nursing for ambulation daily as tolerated for length of stay.          If plan is discharge home, recommend the following: Assistance with cooking/housework;Help with stairs or ramp for entrance   Can travel by private vehicle        Equipment Recommendations None recommended by PT  Recommendations for Other Services       Functional Status Assessment Patient has had a recent decline in their functional status and demonstrates the ability to make significant improvements in function in a reasonable and predictable amount of time.     Precautions / Restrictions Precautions Precautions: Fall Restrictions Weight Bearing Restrictions: No      Mobility  Bed Mobility Overal bed mobility: Modified Independent                  Transfers Overall transfer level: Modified independent                      Ambulation/Gait Ambulation/Gait assistance: Modified  independent (Device/Increase time) Gait Distance (Feet): 100 Feet Assistive device: None, Rolling walker (2 wheels) Gait Pattern/deviations: Decreased step length - right, Decreased step length - left, Decreased stride length Gait velocity: decreased     General Gait Details: slightly labored movement with good return for walking without AD up to doorway, but had to use RW for longer distances due to generalized weakness  Stairs            Wheelchair Mobility     Tilt Bed    Modified Rankin (Stroke Patients Only)       Balance Overall balance assessment: Needs assistance Sitting-balance support: Feet supported, No upper extremity supported Sitting balance-Leahy Scale: Good Sitting balance - Comments: seated at EOB   Standing balance support: During functional activity, No upper extremity supported Standing balance-Leahy Scale: Fair Standing balance comment: fair/good using RW                             Pertinent Vitals/Pain Pain Assessment Pain Assessment: No/denies pain    Home Living Family/patient expects to be discharged to:: Private residence Living Arrangements: Alone Available Help at Discharge: Family;Available PRN/intermittently Type of Home: Mobile home Home Access: Stairs to enter Entrance Stairs-Rails: Right;Left;Can reach both Entrance Stairs-Number of Steps: 2+3   Home Layout: One level Home Equipment: Agricultural consultant (2 wheels);Cane - single point;Shower seat;Grab bars - tub/shower;Grab bars - toilet      Prior Function Prior Level of Function : Independent/Modified Independent;Driving  Mobility Comments: Community ambulation without AD, drives ADLs Comments: Independent     Extremity/Trunk Assessment   Upper Extremity Assessment Upper Extremity Assessment: Overall WFL for tasks assessed;RUE deficits/detail RUE Deficits / Details: right shoulder grossly -4/5 due arthritis per patient RUE: Unable to fully  assess due to pain RUE Sensation: WNL RUE Coordination: WNL    Lower Extremity Assessment Lower Extremity Assessment: Overall WFL for tasks assessed    Cervical / Trunk Assessment Cervical / Trunk Assessment: Normal  Communication   Communication Communication: No apparent difficulties  Cognition Arousal: Alert Behavior During Therapy: WFL for tasks assessed/performed Overall Cognitive Status: Within Functional Limits for tasks assessed                                          General Comments      Exercises     Assessment/Plan    PT Assessment All further PT needs can be met in the next venue of care  PT Problem List Decreased strength;Decreased activity tolerance;Decreased balance;Decreased mobility       PT Treatment Interventions      PT Goals (Current goals can be found in the Care Plan section)  Acute Rehab PT Goals Patient Stated Goal: return home with family to assist PT Goal Formulation: With patient Time For Goal Achievement: 06/26/23 Potential to Achieve Goals: Good    Frequency       Co-evaluation               AM-PAC PT "6 Clicks" Mobility  Outcome Measure Help needed turning from your back to your side while in a flat bed without using bedrails?: None Help needed moving from lying on your back to sitting on the side of a flat bed without using bedrails?: None Help needed moving to and from a bed to a chair (including a wheelchair)?: None Help needed standing up from a chair using your arms (e.g., wheelchair or bedside chair)?: None Help needed to walk in hospital room?: A Little Help needed climbing 3-5 steps with a railing? : A Little 6 Click Score: 22    End of Session   Activity Tolerance: Patient tolerated treatment well;Patient limited by fatigue Patient left: in chair;with call bell/phone within reach Nurse Communication: Mobility status PT Visit Diagnosis: Unsteadiness on feet (R26.81);Other abnormalities of  gait and mobility (R26.89);Muscle weakness (generalized) (M62.81)    Time: 1012-1029 PT Time Calculation (min) (ACUTE ONLY): 17 min   Charges:   PT Evaluation $PT Eval Low Complexity: 1 Low PT Treatments $Therapeutic Activity: 8-22 mins PT General Charges $$ ACUTE PT VISIT: 1 Visit         12:47 PM, 06/26/23 Ocie Bob, MPT Physical Therapist with [facility] [phone] office [phone] mobile phone

## 2023-06-26 NOTE — H&P (Signed)
[facility]   PATIENT NAME: [name]    MR#:  [id number]  DATE OF BIRTH:  1933/10/31  DATE OF ADMISSION:  06/25/2023  PRIMARY CARE PHYSICIAN: April Manson, NP   Patient is coming from: Home  REQUESTING/REFERRING PHYSICIAN: Bethann Berkshire, MD  CHIEF COMPLAINT:   Chief Complaint  Patient presents with   Code Stroke    HISTORY OF PRESENT ILLNESS:  [name] is a 89 y.o. female with medical history significant for diabetes mellitus, hypertension, sleep edema, spinal stenosis,, anemia, who presented to the emergency room with acute onset of slurred speech with associated altered mental status with confusion as well as right facial droop and right-sided upper and lower extremity weakness that started around 7:15 PM.  She denied any associated headache or dizziness or blurred vision.  No paresthesias or urinary or stool incontinence.  No tinnitus or vertigo.  No witnessed seizures.  ED Course: When the patient came to the ER BP was 181/92 with otherwise normal vital signs.  Labs revealed a blood glucose of 166, BUN of 24 creatinine of 1.49 with otherwise unremarkable CMP.  CBC showed leukocytosis of 10.7 and UA came back with 100 protein and was otherwise unremarkable.  Urine drug screen came back negative. EKG as reviewed by me : EKG showed normal sinus rhythm with a rate of 84 with PVCs and probable left atrial enlargement. Imaging: Noncontrasted code stroke CT revealed no acute ventricular abnormality.  It showed chronic ischemic microangiopathy and generalized atrophy.    On 9/20 the patient had a CTA of the head and neck That showed no emergent large vessel occlusion and multifocal severe stenosis/short segment occlusion of the right anterior cerebral artery A2 segment with multifocal severe stenosis of the left MCA M2 branches worst at the inferior division and bilateral carotid bifurcation atherosclerosis without hemodynamically significant stenosis and it showed normal CT  perfusion scan and aortic atherosclerosis.  Most recent 2D echo revealed EF of 60 to 65% and grade 1 diastolic dysfunction on 04/09/2023.  The patient was seen by teleneurology who recommended Plavix loading and continuing on it for 90 days as well as brain MRI without contrast.  There was no repeat echo or head and neck CTA recommended.  The patient will be placed in observation in a telemetry medical bed for further evaluation and management. PAST MEDICAL HISTORY:   Past Medical History:  Diagnosis Date   Anemia    Diabetes mellitus without complication (HCC)    Difficulty sleeping    GERD (gastroesophageal reflux disease)    Hyperlipidemia    Hypertension    Sciatic pain    Shortness of breath dyspnea    with exertion   Spinal stenosis     PAST SURGICAL HISTORY:   Past Surgical History:  Procedure Laterality Date   ABDOMINAL HYSTERECTOMY  1980's   LUMBAR LAMINECTOMY/DECOMPRESSION MICRODISCECTOMY N/A 05/14/2015   Procedure: MICRO LUMBAR DECOMPRESSION L4-L5 POSSIBLE L3-L4 ( 2 LEVELS);  Surgeon: Jene Every, MD;  Location: [facility];  Service: Orthopedics;  Laterality: N/A;    SOCIAL HISTORY:   Social History   Tobacco Use   Smoking status: Never   Smokeless tobacco: Never  Substance Use Topics   Alcohol use: No    FAMILY HISTORY:   Family History  Problem Relation Age of Onset   Hypertension Mother     DRUG ALLERGIES:  No Known Allergies  REVIEW OF SYSTEMS:   ROS As per history of present illness. All  pertinent systems were reviewed above. Constitutional, HEENT, cardiovascular, respiratory, GI, GU, musculoskeletal, neuro, psychiatric, endocrine, integumentary and hematologic systems were reviewed and are otherwise negative/unremarkable except for positive findings mentioned above in the HPI.   MEDICATIONS AT HOME:   Prior to Admission medications   Medication Sig Start Date End Date Taking? Authorizing Provider  albuterol (VENTOLIN HFA) 108 (90 Base)  MCG/ACT inhaler Inhale 2 puffs into the lungs as needed for wheezing or shortness of breath. 02/01/23  Yes [provider]  Ascorbic Acid (VITAMIN C PO) Take 1 tablet by mouth daily.   Yes [provider]  aspirin EC 81 MG tablet Take 81 mg by mouth daily.   Yes [provider]  carvedilol (COREG) 6.25 MG tablet Take 6.25 mg by mouth in the morning and at bedtime. 03/04/23  Yes [provider]  Cyanocobalamin (VITAMIN B-12 PO) Take 1 capsule by mouth daily.   Yes [provider]  losartan (COZAAR) 50 MG tablet Take 50 mg by mouth daily. 02/17/23  Yes [provider]  metFORMIN (GLUCOPHAGE) 500 MG tablet Take 500 mg by mouth in the morning and at bedtime.   Yes [provider]  Multiple Vitamin (MULTIVITAMIN WITH MINERALS) TABS tablet Take 1 tablet by mouth daily.   Yes [provider]  Omega-3 Fatty Acids (FISH OIL PO) Take 2 capsules by mouth daily.   Yes [provider]  simvastatin (ZOCOR) 20 MG tablet Take 20 mg by mouth at bedtime. 02/06/15  Yes [provider]  pantoprazole (PROTONIX) 40 MG tablet Take 1 tablet (40 mg total) by mouth daily. 04/11/23 05/11/23  Berton Mount I, MD      VITAL SIGNS:  Blood pressure (!) 215/75, pulse 87, temperature (!) 97.5 F (36.4 C), temperature source Oral, resp. rate 19, height 5\' 4"  (1.626 m), weight 87.3 kg, SpO2 97%.  PHYSICAL EXAMINATION:  Physical Exam  GENERAL:  89 y.o.-year-old patient lying in the bed with no acute distress.  EYES: Pupils equal, round, reactive to light and accommodation. No scleral icterus. Extraocular muscles intact.  HEENT: Head atraumatic, normocephalic. Oropharynx and nasopharynx clear.  NECK:  Supple, no jugular venous distention. No thyroid enlargement, no tenderness.  LUNGS: Normal breath sounds bilaterally, no wheezing, rales,rhonchi or crepitation. No use of accessory muscles of respiration.  CARDIOVASCULAR: Regular rate and  rhythm, S1, S2 normal. No murmurs, rubs, or gallops.  ABDOMEN: Soft, nondistended, nontender. Bowel sounds present. No organomegaly or mass.  EXTREMITIES: No pedal edema, cyanosis, or clubbing.  NEUROLOGIC: Cranial nerves II through XII are intact. Muscle strength 5/5 in all extremities. Sensation intact. Gait not checked.  PSYCHIATRIC: The patient is alert and oriented x 3.  Normal affect and good eye contact. SKIN: No obvious rash, lesion, or ulcer.   LABORATORY PANEL:   CBC Recent Labs  Lab 06/25/23 1930  WBC 10.7*  HGB 11.6*  HCT 36.2  PLT 283   ------------------------------------------------------------------------------------------------------------------  Chemistries  Recent Labs  Lab 06/25/23 1930  NA 137  K 4.0  CL 103  CO2 25  GLUCOSE 166*  BUN 24*  CREATININE 1.49*  CALCIUM 10.0  AST 20  ALT 17  ALKPHOS 52  BILITOT 0.5   ------------------------------------------------------------------------------------------------------------------  Cardiac Enzymes No results for input(s): "TROPONINI" in the last 168 hours. ------------------------------------------------------------------------------------------------------------------  RADIOLOGY:  CT HEAD CODE STROKE WO CONTRAST  Result Date: 06/25/2023 CLINICAL DATA:  Code stroke. Slurred speech with right-sided paralysis EXAM: CT HEAD WITHOUT CONTRAST TECHNIQUE: Contiguous axial images were obtained from the  base of the skull through the vertex without intravenous contrast. RADIATION DOSE REDUCTION: This exam was performed according to the departmental dose-optimization program which includes automated exposure control, adjustment of the mA and/or kV according to patient size and/or use of iterative reconstruction technique. COMPARISON:  None Available. FINDINGS: Brain: There is no mass, hemorrhage or extra-axial collection. There is generalized atrophy without lobar predilection. There is hypoattenuation of the  periventricular white matter, most commonly indicating chronic ischemic microangiopathy. Vascular: No abnormal hyperdensity of the major intracranial arteries or dural venous sinuses. No intracranial atherosclerosis. Skull: The visualized skull base, calvarium and extracranial soft tissues are normal. Sinuses/Orbits: No fluid levels or advanced mucosal thickening of the visualized paranasal sinuses. No mastoid or middle ear effusion. The orbits are normal. ASPECTS [facility] Stroke Program Early CT Score) - Ganglionic level infarction (caudate, lentiform nuclei, internal capsule, insula, M1-M3 cortex): 7 - Supraganglionic infarction (M4-M6 cortex): 3 Total score (0-10 with 10 being normal): 10 IMPRESSION: 1. No acute intracranial abnormality. 2. ASPECTS is 10. 3. Chronic ischemic microangiopathy and generalized atrophy. These results were called by telephone at the time of interpretation on 06/25/2023 at 8:25 pm to provider [facility] ZAMMIT , who verbally acknowledged these results. Electronically Signed   By: Deatra Robinson M.D.   On: 06/25/2023 20:25      IMPRESSION AND PLAN:  Assessment and Plan: * TIA (transient ischemic attack) - The patient had resolved right side hemiparesis, slurred speech, right facial droop and altered mental status with confusion. - The patient will be admitted to an observation medically monitored bed.   - We will follow neuro checks q.4 hours for 24 hours.   - The patient will be placed on aspirin and Plavix as mentioned above.   - Physical/occupation/speech therapy consults will be obtained in a.m.Marland Kitchen   - The patient will be placed on statin therapy and fasting lipids will be checked.   Dyslipidemia - Working statin therapy and check fasting lipids.  Type 2 diabetes mellitus without complications (HCC) - The patient will be placed on supplemental coverage with NovoLog. - We will hold off metformin.  GERD without esophagitis - We will continue PPI therapy and  Lovaza.Marland Kitchen  Hypertensive urgency - Permissive hypertension will be allowed. - The patient will be placed on as needed IV labetalol.    DVT prophylaxis: Lovenox.  Advanced Care Planning:  Code Status: The patient is DNR and DNI.  This was discussed with her. Family Communication:  The plan of care was discussed in details with the patient (and family). I answered all questions. The patient agreed to proceed with the above mentioned plan. Further management will depend upon hospital course. Disposition Plan: Back to previous home environment Consults called: none.  All the records are reviewed and case discussed with ED provider.  Status is: Observation  I certify that at the time of admission, it is my clinical judgment that the patient will require hospital care extending less than 2 midnights.                            Dispo: The patient is from: Home              Anticipated d/c is to: Home              Patient currently is not medically stable to d/c.              Difficult to place patient: No  Hannah Beat M.D on 06/26/2023 at 1:55 AM  [facility]   From 7 PM-7 AM, contact night-coverage [email or web address]  CC: Primary care physician; April Manson, NP

## 2023-06-26 NOTE — Assessment & Plan Note (Signed)
-   Working statin therapy and check fasting lipids.

## 2023-06-26 NOTE — Discharge Summary (Signed)
Physician Discharge Summary  SHANETRA AMATUCCI HQI:[id number] DOB: 1934-06-20 DOA: 06/25/2023  PCP: April Manson, NP  Admit date: 06/25/2023 Discharge date: 06/26/2023  Admitted From:  Home  Disposition: Home with [facility]   Recommendations for Outpatient Follow-up:  Follow up with PCP in 1 weeks Follow up with neurology in 2 months Neurology recommends take plavix with aspirin for 90 days followed by aspirin monotherapy  Home Health:  PT   Discharge Condition: STABLE   CODE STATUS: DNR DIET: resume previous home diet     Brief Hospitalization Summary: Please see all hospital notes, images, labs for full details of the hospitalization. Admission provider HPI:  89 y.o. female with medical history significant for diabetes mellitus, hypertension, sleep edema, spinal stenosis,, anemia, who presented to the emergency room with acute onset of slurred speech with associated altered mental status with confusion as well as right facial droop and right-sided upper and lower extremity weakness that started around 7:15 PM.  She denied any associated headache or dizziness or blurred vision.  No paresthesias or urinary or stool incontinence.  No tinnitus or vertigo.  No witnessed seizures.   ED Course: When the patient came to the ER BP was 181/92 with otherwise normal vital signs.  Labs revealed a blood glucose of 166, BUN of 24 creatinine of 1.49 with otherwise unremarkable CMP.  CBC showed leukocytosis of 10.7 and UA came back with 100 protein and was otherwise unremarkable.  Urine drug screen came back negative. EKG as reviewed by me : EKG showed normal sinus rhythm with a rate of 84 with PVCs and probable left atrial enlargement. Imaging: Noncontrasted code stroke CT revealed no acute ventricular abnormality.  It showed chronic ischemic microangiopathy and generalized atrophy.     On 9/20 the patient had a CTA of the head and neck That showed no emergent large vessel occlusion and multifocal severe  stenosis/short segment occlusion of the right anterior cerebral artery A2 segment with multifocal severe stenosis of the left MCA M2 branches worst at the inferior division and bilateral carotid bifurcation atherosclerosis without hemodynamically significant stenosis and it showed normal CT perfusion scan and aortic atherosclerosis.   Most recent 2D echo revealed EF of 60 to 65% and grade 1 diastolic dysfunction on 04/09/2023.   The patient was seen by teleneurology who recommended Plavix loading and continuing on it for 90 days as well as brain MRI without contrast.  There was no repeat echo or head and neck CTA recommended.  The patient will be placed in observation in a telemetry medical bed for further evaluation and management.  Hospital Course  Pt was admitted for observation for TIA.  She was sent for MRI brain which came back negative for acute findings.  Pt was evaluated by PT and they have recommended home health services.  Pt says she feels well and is eager to discharge home.  Neurology recommended aspirin and plavix for 90 days followed by aspirin monotherapy.  Pt is stable to discharge home.  Outpatient referral to neurology requested.  Close follow up with PCP recommended.    Discharge Diagnoses:  Principal Problem:   TIA (transient ischemic attack) Active Problems:   Hypertensive urgency   GERD without esophagitis   Type 2 diabetes mellitus without complications (HCC)   Dyslipidemia   Discharge Instructions: Discharge Instructions     Ambulatory referral to Neurology   Complete by: As directed    An appointment is requested in approximately: 8 weeks  Allergies as of 06/26/2023   No Known Allergies      Medication List     TAKE these medications    albuterol 108 (90 Base) MCG/ACT inhaler Commonly known as: VENTOLIN HFA Inhale 2 puffs into the lungs as needed for wheezing or shortness of breath.   aspirin EC 81 MG tablet Take 81 mg by mouth daily.    carvedilol 6.25 MG tablet Commonly known as: COREG Take 6.25 mg by mouth in the morning and at bedtime.   clopidogrel 75 MG tablet Commonly known as: Plavix Take 1 tablet (75 mg total) by mouth daily.   FISH OIL PO Take 2 capsules by mouth daily.   losartan 50 MG tablet Commonly known as: COZAAR Take 50 mg by mouth daily.   metFORMIN 500 MG tablet Commonly known as: GLUCOPHAGE Take 500 mg by mouth in the morning and at bedtime.   multivitamin with minerals Tabs tablet Take 1 tablet by mouth daily.   pantoprazole 40 MG tablet Commonly known as: PROTONIX Take 1 tablet (40 mg total) by mouth daily.   simvastatin 20 MG tablet Commonly known as: ZOCOR Take 20 mg by mouth at bedtime.   VITAMIN B-12 PO Take 1 capsule by mouth daily.   VITAMIN C PO Take 1 tablet by mouth daily.        Follow-up Information     April Manson, NP. Schedule an appointment as soon as possible for a visit in 1 week(s).   Specialty: Family Medicine Why: Hospital Follow Up Contact information: [address] [place] [address] [phone]         [facility]. Schedule an appointment as soon as possible for a visit in 2 month(s).   Specialty: Neurology Why: Hospital Follow Up Contact information: [address] [place] [address] [phone]               No Known Allergies Allergies as of 06/26/2023   No Known Allergies      Medication List     TAKE these medications    albuterol 108 (90 Base) MCG/ACT inhaler Commonly known as: VENTOLIN HFA Inhale 2 puffs into the lungs as needed for wheezing or shortness of breath.   aspirin EC 81 MG tablet Take 81 mg by mouth daily.   carvedilol 6.25 MG tablet Commonly known as: COREG Take 6.25 mg by mouth in the morning and at bedtime.   clopidogrel 75 MG tablet Commonly known as: Plavix Take 1 tablet (75 mg total) by mouth daily.   FISH OIL  PO Take 2 capsules by mouth daily.   losartan 50 MG tablet Commonly known as: COZAAR Take 50 mg by mouth daily.   metFORMIN 500 MG tablet Commonly known as: GLUCOPHAGE Take 500 mg by mouth in the morning and at bedtime.   multivitamin with minerals Tabs tablet Take 1 tablet by mouth daily.   pantoprazole 40 MG tablet Commonly known as: PROTONIX Take 1 tablet (40 mg total) by mouth daily.   simvastatin 20 MG tablet Commonly known as: ZOCOR Take 20 mg by mouth at bedtime.   VITAMIN B-12 PO Take 1 capsule by mouth daily.   VITAMIN C PO Take 1 tablet by mouth daily.        Procedures/Studies: MR BRAIN WO CONTRAST  Result Date: 06/26/2023 CLINICAL DATA:  89 year old female code stroke presentation. EXAM: MRI HEAD WITHOUT CONTRAST TECHNIQUE: Multiplanar, multiecho pulse sequences of the brain and  surrounding structures were obtained without intravenous contrast. COMPARISON:  Head CT yesterday.  Brain MRI 04/09/2023. FINDINGS: Brain: No restricted diffusion or evidence of acute infarction. Confluent bilateral cerebral white matter T2 and FLAIR hyperintensity in both hemispheres is stable. Small scattered areas of cystic white matter encephalomalacia. T2 heterogeneity in the bilateral deep gray nuclei is stable. Tiny chronic lacunar infarct in the right cerebellum is stable. SWI redemonstrates scattered chronic microhemorrhages, clustered in the left basal ganglia, left thalamus. Chronic left brainstem involvement also. Chronic right hippocampus involvement. The extent does not rise to the level of amyloid angiopathy. No new signal abnormality identified. No midline shift, mass effect, evidence of mass lesion, ventriculomegaly, extra-axial collection or acute intracranial hemorrhage. Cervicomedullary junction and pituitary are within normal limits. Vascular: Major intracranial vascular flow voids are stable, preserved. Skull and upper cervical spine: Stable and negative for age. Visualized  bone marrow signal is within normal limits. Sinuses/Orbits: Stable postoperative changes to the globes. Mild paranasal sinus inflammation has not significantly changed. No layering sinus fluid. Other: Visible internal auditory structures appear normal. Mastoids are clear. Negative visible scalp and face. IMPRESSION: 1. No acute intracranial abnormality. 2. Stable chronic small vessel disease, multiple chronic microhemorrhages but no strong evidence of amyloid angiopathy. Electronically Signed   By: Odessa Fleming M.D.   On: 06/26/2023 10:31   CT HEAD CODE STROKE WO CONTRAST  Result Date: 06/25/2023 CLINICAL DATA:  Code stroke. Slurred speech with right-sided paralysis EXAM: CT HEAD WITHOUT CONTRAST TECHNIQUE: Contiguous axial images were obtained from the base of the skull through the vertex without intravenous contrast. RADIATION DOSE REDUCTION: This exam was performed according to the departmental dose-optimization program which includes automated exposure control, adjustment of the mA and/or kV according to patient size and/or use of iterative reconstruction technique. COMPARISON:  None Available. FINDINGS: Brain: There is no mass, hemorrhage or extra-axial collection. There is generalized atrophy without lobar predilection. There is hypoattenuation of the periventricular white matter, most commonly indicating chronic ischemic microangiopathy. Vascular: No abnormal hyperdensity of the major intracranial arteries or dural venous sinuses. No intracranial atherosclerosis. Skull: The visualized skull base, calvarium and extracranial soft tissues are normal. Sinuses/Orbits: No fluid levels or advanced mucosal thickening of the visualized paranasal sinuses. No mastoid or middle ear effusion. The orbits are normal. ASPECTS [facility] Stroke Program Early CT Score) - Ganglionic level infarction (caudate, lentiform nuclei, internal capsule, insula, M1-M3 cortex): 7 - Supraganglionic infarction (M4-M6 cortex): 3 Total score  (0-10 with 10 being normal): 10 IMPRESSION: 1. No acute intracranial abnormality. 2. ASPECTS is 10. 3. Chronic ischemic microangiopathy and generalized atrophy. These results were called by telephone at the time of interpretation on 06/25/2023 at 8:25 pm to provider [facility] ZAMMIT , who verbally acknowledged these results. Electronically Signed   By: Deatra Robinson M.D.   On: 06/25/2023 20:25     Subjective: Pt says she feels well and wants to go home today, she is back to her baseline.   Discharge Exam: Vitals:   06/26/23 0218 06/26/23 0418  BP: (!) 189/64 (!) 161/76  Pulse: 85 81  Resp: 18 18  Temp: (!) 97.4 F (36.3 C) 97.6 F (36.4 C)  SpO2: 100% 100%   Vitals:   06/25/23 2340 06/26/23 0150 06/26/23 0218 06/26/23 0418  BP: (!) 179/75 (!) 215/75 (!) 189/64 (!) 161/76  Pulse: 74 87 85 81  Resp: 19 (!) 23 18 18   Temp:  (!) 97.5 F (36.4 C) (!) 97.4 F (36.3 C) 97.6 F (36.4 C)  TempSrc:  Oral Oral Oral  SpO2: 97% 97% 100% 100%  Weight:      Height:        General: Pt is alert, awake, not in acute distress Cardiovascular: RRR, S1/S2 +, no rubs, no gallops Respiratory: CTA bilaterally, no wheezing, no rhonchi Abdominal: Soft, NT, ND, bowel sounds + Extremities: no edema, no cyanosis   The results of significant diagnostics from this hospitalization (including imaging, microbiology, ancillary and laboratory) are listed below for reference.     Microbiology: No results found for this or any previous visit (from the past 240 hour(s)).   Labs: BNP (last 3 results) No results for input(s): "BNP" in the last 8760 hours. Basic Metabolic Panel: Recent Labs  Lab 06/25/23 1930 06/26/23 0437  NA 137 137  K 4.0 3.6  CL 103 104  CO2 25 24  GLUCOSE 166* 201*  BUN 24* 26*  CREATININE 1.49* 1.45*  CALCIUM 10.0 9.7   Liver Function Tests: Recent Labs  Lab 06/25/23 1930  AST 20  ALT 17  ALKPHOS 52  BILITOT 0.5  PROT 6.9  ALBUMIN 4.0   No results for input(s):  "LIPASE", "AMYLASE" in the last 168 hours. No results for input(s): "AMMONIA" in the last 168 hours. CBC: Recent Labs  Lab 06/25/23 1930 06/26/23 0437  WBC 10.7* 7.9  NEUTROABS 6.4  --   HGB 11.6* 10.3*  HCT 36.2 33.2*  MCV 89.4 89.2  PLT 283 247   Cardiac Enzymes: No results for input(s): "CKTOTAL", "CKMB", "CKMBINDEX", "TROPONINI" in the last 168 hours. BNP: Invalid input(s): "POCBNP" CBG: Recent Labs  Lab 06/25/23 2008 06/25/23 2357 06/26/23 0735  GLUCAP 156* 110* 133*   D-Dimer No results for input(s): "DDIMER" in the last 72 hours. Hgb A1c No results for input(s): "HGBA1C" in the last 72 hours. Lipid Profile Recent Labs    06/26/23 0437  CHOL 129  HDL 40*  LDLCALC 68  TRIG 295  CHOLHDL 3.2   Thyroid function studies No results for input(s): "TSH", "T4TOTAL", "T3FREE", "THYROIDAB" in the last 72 hours.  Invalid input(s): "FREET3" Anemia work up No results for input(s): "VITAMINB12", "FOLATE", "FERRITIN", "TIBC", "IRON", "RETICCTPCT" in the last 72 hours. Urinalysis    Component Value Date/Time   COLORURINE YELLOW 06/25/2023 2044   APPEARANCEUR CLEAR 06/25/2023 2044   LABSPEC 1.016 06/25/2023 2044   PHURINE 5.0 06/25/2023 2044   GLUCOSEU NEGATIVE 06/25/2023 2044   HGBUR NEGATIVE 06/25/2023 2044   BILIRUBINUR NEGATIVE 06/25/2023 2044   KETONESUR NEGATIVE 06/25/2023 2044   PROTEINUR 100 (A) 06/25/2023 2044   UROBILINOGEN 0.2 12/25/2009 0900   NITRITE NEGATIVE 06/25/2023 2044   LEUKOCYTESUR NEGATIVE 06/25/2023 2044   Sepsis Labs Recent Labs  Lab 06/25/23 1930 06/26/23 0437  WBC 10.7* 7.9   Microbiology No results found for this or any previous visit (from the past 240 hour(s)).  Time coordinating discharge:  38 mins  SIGNED:  Standley Dakins, MD  [facility] 06/26/2023, 10:57 AM How to contact the [facility] Attending or Consulting provider 7A - 7P or covering provider during after hours 7P -7A, for this patient?  Check the care team in  [facility] and look for a) attending/consulting [facility] provider listed and b) the [facility] team listed Log into [email or web address] and use [place]'s universal password to access. If you do not have the password, please contact the hospital operator. Locate the [facility] provider you are looking for under [facility] and page to a number that you can be directly reached. If you  still have difficulty reaching the provider, please page the [facility] (Director on Call) for the Hospitalists listed on amion for assistance.

## 2023-06-26 NOTE — Hospital Course (Signed)
89 y.o. female with medical history significant for diabetes mellitus, hypertension, sleep edema, spinal stenosis,, anemia, who presented to the emergency room with acute onset of slurred speech with associated altered mental status with confusion as well as right facial droop and right-sided upper and lower extremity weakness that started around 7:15 PM.  She denied any associated headache or dizziness or blurred vision.  No paresthesias or urinary or stool incontinence.  No tinnitus or vertigo.  No witnessed seizures.   ED Course: When the patient came to the ER BP was 181/92 with otherwise normal vital signs.  Labs revealed a blood glucose of 166, BUN of 24 creatinine of 1.49 with otherwise unremarkable CMP.  CBC showed leukocytosis of 10.7 and UA came back with 100 protein and was otherwise unremarkable.  Urine drug screen came back negative. EKG as reviewed by me : EKG showed normal sinus rhythm with a rate of 84 with PVCs and probable left atrial enlargement. Imaging: Noncontrasted code stroke CT revealed no acute ventricular abnormality.  It showed chronic ischemic microangiopathy and generalized atrophy.     On 9/20 the patient had a CTA of the head and neck That showed no emergent large vessel occlusion and multifocal severe stenosis/short segment occlusion of the right anterior cerebral artery A2 segment with multifocal severe stenosis of the left MCA M2 branches worst at the inferior division and bilateral carotid bifurcation atherosclerosis without hemodynamically significant stenosis and it showed normal CT perfusion scan and aortic atherosclerosis.   Most recent 2D echo revealed EF of 60 to 65% and grade 1 diastolic dysfunction on 04/09/2023.   The patient was seen by teleneurology who recommended Plavix loading and continuing on it for 90 days as well as brain MRI without contrast.  There was no repeat echo or head and neck CTA recommended.  The patient will be placed in observation in a  telemetry medical bed for further evaluation and management.

## 2023-06-26 NOTE — Progress Notes (Signed)
   06/26/23 1355  TOC Brief Assessment  Insurance and Status Reviewed  Patient has primary care physician Yes  Home environment has been reviewed From home  Prior level of function: Independent/family support  Prior/Current Home Services No current home services  Social Determinants of Health Reivew SDOH reviewed no interventions necessary  Readmission risk has been reviewed Yes  Transition of care needs transition of care needs identified, TOC will continue to follow

## 2023-06-26 NOTE — Assessment & Plan Note (Addendum)
-   The patient had resolved right side hemiparesis, slurred speech, right facial droop and altered mental status with confusion. - The patient will be admitted to an observation medically monitored bed.   - We will follow neuro checks q.4 hours for 24 hours.   - The patient will be placed on aspirin and Plavix as mentioned above.   - Physical/occupation/speech therapy consults will be obtained in a.m.Marland Kitchen   - The patient will be placed on statin therapy and fasting lipids will be checked.

## 2023-06-26 NOTE — Assessment & Plan Note (Addendum)
-   We will continue PPI therapy and Lovaza.Marland Kitchen

## 2023-08-23 ENCOUNTER — Ambulatory Visit: Payer: Medicare Other | Admitting: Neurology

## 2023-08-23 ENCOUNTER — Encounter: Payer: Self-pay | Admitting: Neurology

## 2023-08-23 VITALS — BP 160/80 | HR 71 | Ht 64.0 in | Wt 196.2 lb

## 2023-08-23 DIAGNOSIS — G459 Transient cerebral ischemic attack, unspecified: Secondary | ICD-10-CM

## 2023-08-23 DIAGNOSIS — G40109 Localization-related (focal) (partial) symptomatic epilepsy and epileptic syndromes with simple partial seizures, not intractable, without status epilepticus: Secondary | ICD-10-CM

## 2023-08-23 MED ORDER — LEVETIRACETAM 250 MG PO TABS: 250.0000 mg | ORAL_TABLET | Freq: Two times a day (BID) | ORAL | 5 refills | Status: DC

## 2023-08-23 NOTE — Patient Instructions (Signed)
 Absence seizure, speech arrest, resolved in less than 3 minutes.   Levetiracetam  Tablets What is this medication? LEVETIRACETAM  (lee ve tye RA se tam) prevents and controls seizures in people with epilepsy. It works by calming overactive nerves in your body. This medicine may be used for other purposes; ask your health care provider or pharmacist if you have questions. COMMON BRAND NAME(S): Keppra , Roweepra  What should I tell my care team before I take this medication? They need to know if you have any of these conditions: Kidney disease Suicidal thoughts, plans, or attempt by you or a family member An unusual or allergic reaction to levetiracetam , other medications, foods, dyes, or preservatives Pregnant or trying to get pregnant Breast-feeding How should I use this medication? Take this medication by mouth with a glass of water. Follow the directions on the prescription label. Swallow the tablets whole. Do not crush or chew this medication. You may take this medication with or without food. Take your doses at regular intervals. Do not take your medication more often than directed. Do not stop taking this medication or any of your seizure medications unless instructed by your care team. Stopping your medication suddenly can increase your seizures or their severity. A special MedGuide will be given to you by the pharmacist with each prescription and refill. Be sure to read this information carefully each time. Contact your care team about the use of this medication in children. While this medication may be prescribed for children as young as 70 years of age for selected conditions, precautions do apply. Overdosage: If you think you have taken too much of this medicine contact a poison control center or emergency room at once. NOTE: This medicine is only for you. Do not share this medicine with others. What if I miss a dose? If you miss a dose, take it as soon as you can. If it is almost time for  your next dose, take only that dose. Do not take double or extra doses. What may interact with this medication? This medication may interact with the following: Carbamazepine Colesevelam Probenecid Sevelamer This list may not describe all possible interactions. Give your health care provider a list of all the medicines, herbs, non-prescription drugs, or dietary supplements you use. Also tell them if you smoke, drink alcohol, or use illegal drugs. Some items may interact with your medicine. What should I watch for while using this medication? Visit your care team for a regular check on your progress. Wear a medical identification bracelet or chain to say you have epilepsy, and carry a card that lists all your medications. This medication may cause serious skin reactions. They can happen weeks to months after starting the medication. Contact your care team right away if you notice fevers or flu-like symptoms with a rash. The rash may be red or purple and then turn into blisters or peeling of the skin. You may also notice a red rash with swelling of the face, lips, or lymph nodes in your neck or under your arms. It is important to take this medication exactly as instructed by your care team. When first starting treatment, your dose may need to be adjusted. It may take weeks or months before your dose is stable. You should contact your care team if your seizures get worse or if you have any new types of seizures. This medication may affect your coordination, reaction time, or judgment. Do not drive or operate machinery until you know how this medication affects you. Sit  up or stand slowly to reduce the risk of dizzy or fainting spells. Drinking alcohol with this medication can increase the risk of these side effects. This medication may cause thoughts of suicide or depression. This includes sudden changes in mood, behaviors, or thoughts. These changes can happen at any time but are more common in the  beginning of treatment or after a change in dose. Call your care team right away if you experience these thoughts or worsening depression. If you become pregnant while using this medication, you may enroll in the [place] American Antiepileptic Drug Pregnancy Registry by calling [id number]. This registry collects information about the safety of antiepileptic medication use during pregnancy. What side effects may I notice from receiving this medication? Side effects that you should report to your care team as soon as possible: Allergic reactions or angioedema--skin rash, itching or hives, swelling of the face, eyes, lips, tongue, arms, or legs, trouble swallowing or breathing Increase in blood pressure in children Infection--fever, chills, cough, or sore throat Loss of balance or coordination Low red blood cell level--unusual weakness or fatigue, dizziness, headache, trouble breathing Mood and behavior changes--anxiety, nervousness, confusion, hallucinations, irritability, hostility, thoughts of suicide or self-harm, worsening mood, feelings of depression Rash, fever, and swollen lymph nodes Redness, swelling, and blistering of the skin over hands and feet Trouble walking Unusual bruising or bleeding Unusual weakness or fatigue Side effects that usually do not require medical attention (report these to your care team if they continue or are bothersome): Dizziness Drowsiness Fatigue Irritability Loss of appetite This list may not describe all possible side effects. Call your doctor for medical advice about side effects. You may report side effects to FDA at 1-800-FDA-1088. Where should I keep my medication? Keep out of reach of children. Store at room temperature between 15 and 30 degrees C (59 and 86 degrees F). Throw away any unused medication after the expiration date. NOTE: This sheet is a summary. It may not cover all possible information. If you have questions about this medicine, talk  to your doctor, pharmacist, or health care provider.  2024 Elsevier/Gold Standard (2023-06-17 00:00:00)

## 2023-08-23 NOTE — Progress Notes (Addendum)
 [facility]  STROKE TEAM REFERRAL for Follow up on TIA   Referring Provider: Vicci Afton CROME, MD  Primary Care Physician:  Teresa Aldona CROME, NP  Chief Complaint  Patient presents with   New Patient (Initial Visit)    Pt in 1 with granddaughter  Pt here for TIA referral. Pt states tingling in both hands,right side weakness, Pt states 1 fall in last six months      HPI:  [name] is a 89 y.o. female [facility].   I reviewed the patient's chart and she had multiple emergency room visits that led to admission to the hospital.  The first 1 I saw was from 9-20 last year 2024 at 7 PM. She was seen at the [facility] health emergency department w@ [facility] in [place].   As a suspected stroke she appeared to have slurred speech and dysphagia / inability to swallow .  The differential diagnosis included TIA, substance abuse, metabolic encephalopathy.   2-3 minutes of  speech arrest .  Stroke team : [name] is a 89 y.o. female with history of diabetes, hypertension, hyperlipidemia, seen by neurology outpatient for progressive memory loss and multiple falls in 2019 presented for evaluation after sustaining a fall last night and found this morning at 11 AM laying in the bathroom by the family. Then she has been having intermittent expressive aphasia ever since 5:45 PM. EEG negative  echo was reportedly normal.  Stress test  reportedly normal.     On 12-07 she was back in hospital  ED , at [facility] : [name] is a 89 y.o. female. Patient was at church tonight and at 515  PM she started to get confused having slurred speech and had weakness on the right side.  Patient has a history of hypertension diabetes and has had a TIA before. Patient was seen by neurology and they recommended putting the patient on Plavix  and get an MRI in the morning. Neurology did not feel like a CT angio was necessary  since she had one recently. She was admitted recently and had a full TIA workup  Again, the whole spell lasted 2-4 minutes.    MRI :  Progressive mild atrophy and moderate diffuse white matter disease is present. A lacunar infarct involving the left thalamus is new since the prior study but appears remote. Associated volume loss is present.   The deep brain nuclei are otherwise within normal limits. No acute or focal cortical infarct is present. No acute hemorrhage is present.   The brainstem and cerebellum are within normal limits. Midline structures are within normal limits.   Vascular: Atherosclerotic calcifications are present within the cavernous internal carotid arteries bilaterally. No hyperdense vessel is present.     Review of Systems: Out of a complete 14 system review, the patient complains of only the following symptoms, and all other reviewed systems are negative.   Question of TIA or mini seizures.   The patient is not concerned about memory .     Social History   Socioeconomic History   Marital status: Widowed    Spouse name: Not on file   Number of children: 6   Years of education: 2nd grade   Highest education level: Not on file  Occupational History   Not on file  Tobacco Use   Smoking status: Never   Smokeless tobacco: Never  Vaping Use   Vaping status: Never Used  Substance and Sexual Activity   Alcohol use: No   Drug use: No   Sexual activity: Not Currently  Other Topics Concern   Not on file  Social History Narrative   Pt lives alone    Retired    Social Drivers of Corporate Investment Banker Strain: Low Risk  (12/14/2022)   Received from [facility]   Overall Financial Resource Strain (CARDIA)    Difficulty of Paying Living Expenses: Not hard at all  Food Insecurity: No Food Insecurity (06/25/2023)   Hunger Vital Sign    Worried About Running Out of Food in the Last Year: Never true    Ran Out of Food in the Last Year: Never true   Transportation Needs: No Transportation Needs (06/25/2023)   PRAPARE - Administrator, Civil Service (Medical): No    Lack of Transportation (Non-Medical): No  Physical Activity: Inactive (12/14/2022)   Received from [facility]   Exercise Vital Sign    Days of Exercise per Week: 4 days    Minutes of Exercise per Session: 0 min  Stress: No Stress Concern Present (12/14/2022)   Received from [facility] - Occupational Stress Questionnaire    Feeling of Stress : Only a little  Social Connections: Moderately Integrated (12/14/2022)   Received from [facility]   Social Network    How would you rate your social network (family, work, friends)?: Adequate participation with social networks  Intimate Partner Violence: Not At Risk (06/25/2023)   Humiliation, Afraid, Rape, and Kick questionnaire    Fear of Current or Ex-Partner: No    Emotionally Abused: No    Physically Abused: No    Sexually Abused: No    Family History  Problem Relation Age of Onset   Hypertension Mother    Stroke Mother    Stroke Father    Stroke Sister    Stroke Daughter     Past Medical History:  Diagnosis Date   Anemia    Diabetes mellitus without complication (HCC)    Difficulty sleeping    GERD (gastroesophageal reflux disease)    Hyperlipidemia    Hypertension    Sciatic pain    Shortness of breath dyspnea    with exertion   Spinal stenosis     Past Surgical History:  Procedure Laterality Date   ABDOMINAL HYSTERECTOMY  1980's   LUMBAR LAMINECTOMY/DECOMPRESSION MICRODISCECTOMY N/A 05/14/2015   Procedure: MICRO LUMBAR DECOMPRESSION L4-L5 POSSIBLE L3-L4 ( 2 LEVELS);  Surgeon: Reyes Billing, MD;  Location: [facility];  Service: Orthopedics;  Laterality: N/A;    Current Outpatient Medications  Medication Sig Dispense Refill   albuterol  (VENTOLIN  HFA) 108 (90 Base) MCG/ACT inhaler Inhale 2 puffs into the lungs as needed for wheezing or shortness  of breath.     Ascorbic Acid  (VITAMIN C  PO) Take 1 tablet by mouth daily.     aspirin  EC 81 MG tablet Take 81 mg by mouth daily.     Calcium-Magnesium -Vitamin D (CALCIUM 1200+D3 PO) Take by mouth.     carvedilol  (COREG ) 6.25 MG tablet Take 6.25 mg by mouth in the morning and at bedtime.     Cetirizine HCl 10 MG CAPS      clopidogrel  (PLAVIX ) 75 MG tablet Take 1 tablet (75 mg total) by mouth daily. 30 tablet 2   Cyanocobalamin  (VITAMIN B-12 PO) Take 1 capsule by mouth daily.  losartan  (COZAAR ) 50 MG tablet Take 50 mg by mouth daily.     metFORMIN (GLUCOPHAGE) 500 MG tablet Take 500 mg by mouth in the morning and at bedtime.     Multiple Vitamin (MULTIVITAMIN WITH MINERALS) TABS tablet Take 1 tablet by mouth daily.     Omega-3 Fatty Acids (FISH OIL PO) Take 2 capsules by mouth daily.     simvastatin  (ZOCOR ) 20 MG tablet Take 20 mg by mouth at bedtime.  1   pantoprazole  (PROTONIX ) 40 MG tablet Take 1 tablet (40 mg total) by mouth daily. 30 tablet 0   No current facility-administered medications for this visit.    Allergies as of 08/23/2023   (No Known Allergies)    Vitals: BP (!) 160/80 (BP Location: Left Arm, Patient Position: Sitting, Cuff Size: Large) Comment: manual  Pulse 71   Ht 5' 4 (1.626 m)   Wt 196 lb 3.2 oz (89 kg)   BMI 33.68 kg/m  Last Weight:  Wt Readings from Last 1 Encounters:  08/23/23 196 lb 3.2 oz (89 kg)   Last Height:   Ht Readings from Last 1 Encounters:  08/23/23 5' 4 (1.626 m)   Last BMI: @LASTBMI  Physical exam:  General: The patient is awake, alert and appears not in acute distress.  The patient is well groomed. Head: Normocephalic, atraumatic.  Neck is supple.  Neck circumference:16 Cardiovascular:  Regular rate and palpable peripheral pulse:  Respiratory: clear to auscultation.   Skin:  Without evidence of edema, or rash Trunk: patient  leans onto a cane    Neurologic exam : The patient is awake and alert, oriented to place and time.   Memory subjective  described as intact.  There is a normal attention span & concentration ability.  Speech is fluent without  dysarthria,    a bit of dysphonia - she is edentulous and doesn't wear dentures. .  Mood and affect are appropriate.  Cranial nerves: Pupils are equal and briskly reactive to light. Funduscopic exam without  evidence of pallor or edema.  Extraocular movements  in vertical and horizontal planes intact and without nystagmus. Visual fields by finger perimetry are intact. Hearing to finger rub intact.  Facial sensation intact to fine touch.  Facial motor strength is symmetric and tongue and uvula move midline.  Motor exam:   Normal tone and normal muscle bulk and symmetric normal strength in all extremities. Grip Strength equal  Proximal strength of shoulder muscles - arthritic ROM restriction in the right shoulder.   Sensory:  Fine touch and vibration were tested .  Proprioception was tested in the upper extremities only and was  normal.  Coordination: Rapid alternating movements in the fingers/hands were slower.   Finger-to-nose maneuver was tested and showed evidence of ataxia, dysmetria and left hand action  tremor.  Gait and station: Patient walked with a cane for assistive device , had one fall-   Core Strength within normal limits. Deep tendon reflexes: in the  upper and lower extremities are symmetric Babinski maneuver response is downgoing.   Assessment: Total time for face to face interview and examination, for review of  images and laboratory testing, neurophysiology testing and pre-existing records, including out-of -network , was 40 minutes. Assessment is as follows here:  1)   stroke work up was negative and would not need to be repeated. I will opt to search for seizures- possible absence.  2)  I will order an EEG and start Low dose keppra . 250 mg bid  3) I had reviewed all cardiological tests, imaging studies, labs and neuro-consultations.     Rv in 6 month or earlier prn.  EEG .  Dedra Gores, MD

## 2023-09-19 ENCOUNTER — Ambulatory Visit: Payer: Medicare Other | Admitting: Neurology

## 2023-09-19 DIAGNOSIS — G40109 Localization-related (focal) (partial) symptomatic epilepsy and epileptic syndromes with simple partial seizures, not intractable, without status epilepticus: Secondary | ICD-10-CM | POA: Diagnosis not present

## 2023-09-20 ENCOUNTER — Encounter: Payer: Self-pay | Admitting: Neurology

## 2023-09-20 NOTE — Procedures (Signed)
    History:  89 year old woman with seizure  EEG classification: Awake and drowsy  Duration: 25 minutes  Technical aspects: This EEG study was done with scalp electrodes positioned according to the 10-20 International system of electrode placement. Electrical activity was reviewed with band pass filter of 1-70Hz , sensitivity of 7 uV/mm, display speed of 40mm/sec with a 60Hz  notched filter applied as appropriate. EEG data were recorded continuously and digitally stored.   Description of the recording: The background rhythms of this recording consists of a fairly well modulated medium amplitude alpha rhythm of 8-9 Hz that is reactive to eye opening and closure. Present in the anterior head region is a 15-20 Hz beta activity. Photic stimulation was performed, did not show any abnormalities. Hyperventilation was also not performed. Drowsiness was manifested by background fragmentation. No abnormal epileptiform discharges seen during this recording. There was no focal slowing. There were no electrographic seizure identified.   Abnormality: None   Impression: This is a normal awake and drowsy EEG. No evidence of interictal epileptiform discharges. Normal EEGs, however, do not rule out epilepsy.    Windell Norfolk, MD [facility]

## 2023-10-21 ENCOUNTER — Other Ambulatory Visit: Payer: Self-pay | Admitting: Neurology

## 2024-03-07 ENCOUNTER — Encounter: Payer: Self-pay | Admitting: Adult Health

## 2024-03-07 ENCOUNTER — Ambulatory Visit: Payer: Medicare Other | Admitting: Adult Health

## 2024-03-07 VITALS — BP 142/66 | HR 60 | Ht 64.0 in | Wt 198.2 lb

## 2024-03-07 DIAGNOSIS — R29818 Other symptoms and signs involving the nervous system: Secondary | ICD-10-CM | POA: Diagnosis not present

## 2024-03-07 MED ORDER — LEVETIRACETAM 250 MG PO TABS: 250.0000 mg | ORAL_TABLET | Freq: Two times a day (BID) | ORAL | 11 refills | Status: AC

## 2024-03-07 NOTE — Patient Instructions (Addendum)
 Continue keppra  250mg  twice daily  Continue aspirin  81 mg daily  and simvastatin   for secondary stroke prevention  Continue to follow up with PCP regarding cholesterol, blood pressure and diabetes management  Maintain strict control of hypertension with blood pressure goal below 130/90, diabetes with hemoglobin A1c goal below 7.0 % and cholesterol with LDL cholesterol (bad cholesterol) goal below 70 mg/dL.   Signs of a Stroke? Follow the BEFAST method:  Balance Watch for a sudden loss of balance, trouble with coordination or vertigo Eyes Is there a sudden loss of vision in one or both eyes? Or double vision?  Face: Ask the person to smile. Does one side of the face droop or is it numb?  Arms: Ask the person to raise both arms. Does one arm drift downward? Is there weakness or numbness of a leg? Speech: Ask the person to repeat a simple phrase. Does the speech sound slurred/strange? Is the person confused ? Time: If you observe any of these signs, call 911.     Followup in the future with me in 6-8 months or call earlier if needed       Thank you for coming to see us  at [facility]. I hope we have been able to provide you high quality care today.  You may receive a patient satisfaction survey over the next few weeks. We would appreciate your feedback and comments so that we may continue to improve ourselves and the health of our patients.

## 2024-03-07 NOTE — Progress Notes (Signed)
 [facility] [address] [place]. [place] [address] [phone]       OFFICE FOLLOW UP NOTE  [name] Date of Birth:  10-28-1933 Medical Record Number:  [id number]    Primary neurologist: Dr. Chalice Reason for visit: Seizure-like activity    SUBJECTIVE:  CHIEF COMPLAINT:  Chief Complaint  Patient presents with   Follow-up    RM 3, Pt w/granddaughter, here for 6 mos for f/u. Undetermined stroke - more typical of seizure activity. Gdtr reports the only issues Pt is having is with BP.    Follow-up visit:  Prior visit: 08/23/2023 with Dr. Chalice (initial consult visit)  Brief HPI:   [name] is a 89 y.o. female with transient episode of speech difficulty and dysphagia in 03/2023 and recurrent slurred speech with altered mental status and right-sided weakness in 06/2023. Worked up for stroke which was negative for acute abnormalities and diagnosed with TIA.  She was evaluated by Dr. Chalice in 08/2023 for hospital follow-up and suspected possible partial seizure.  Recommended completion of EEG and started on Keppra  250 mg twice daily.     Interval history:  Patient returns for follow-up visit accompanied by her granddaughter.  She has been doing well since prior visit without any recurrent transient neurological episodes.  She is tolerating Keppra  without side effects. EEG 09/2023 no evidence of epileptiform discharges. She lives alone but has very supportive family who lives next-door.  She is able to maintain ADLs independently and family assists with majority of IADLs.  She no longer drives.  Denies any recent falls, does have life alert button, use of cane.  She remains on aspirin  and simvastatin .  Has been having some difficulty with blood pressure management.  Was seen by cardiology 6/5 and started amlodipine and dosage increased by PCP on 6/20.  She has follow-up visit with PCP next month.  Monitors blood pressure at home and typically 140s/70s.   No further questions or concerns at this time.    ROS:   14 system review of systems performed and negative with exception of those listed in HPI  PMH:  Past Medical History:  Diagnosis Date   Anemia    Diabetes mellitus without complication (HCC)    Difficulty sleeping    GERD (gastroesophageal reflux disease)    Hyperlipidemia    Hypertension    Sciatic pain    Shortness of breath dyspnea    with exertion   Spinal stenosis     PSH:  Past Surgical History:  Procedure Laterality Date   ABDOMINAL HYSTERECTOMY  1980's   LUMBAR LAMINECTOMY/DECOMPRESSION MICRODISCECTOMY N/A 05/14/2015   Procedure: MICRO LUMBAR DECOMPRESSION L4-L5 POSSIBLE L3-L4 ( 2 LEVELS);  Surgeon: Reyes Billing, MD;  Location: [facility];  Service: Orthopedics;  Laterality: N/A;    Social History:  Social History   Socioeconomic History   Marital status: Widowed    Spouse name: Not on file   Number of children: 6   Years of education: 2nd grade   Highest education level: Not on file  Occupational History   Not on file  Tobacco Use   Smoking status: Never   Smokeless tobacco: Never  Vaping Use   Vaping status: Never Used  Substance and Sexual Activity   Alcohol use: No   Drug use: No   Sexual activity: Not Currently  Other Topics Concern   Not on file  Social History Narrative   Pt lives alone    Retired  Social Drivers of Corporate investment banker Strain: Low Risk  (01/06/2024)   Received from [facility]   Overall Financial Resource Strain (CARDIA)    Difficulty of Paying Living Expenses: Not hard at all  Food Insecurity: No Food Insecurity (01/06/2024)   Received from [facility]   Hunger Vital Sign    Within the past 12 months, you worried that your food would run out before you got the money to buy more.: Never true    Within the past 12 months, the food you bought just didn't last and you didn't have money to get more.: Never true  Transportation Needs: No Transportation Needs  (01/06/2024)   Received from [facility] - Transportation    Lack of Transportation (Medical): No    Lack of Transportation (Non-Medical): No  Physical Activity: Unknown (01/06/2024)   Received from [facility]   Exercise Vital Sign    On average, how many days per week do you engage in moderate to strenuous exercise (like a brisk walk)?: 0 days    Minutes of Exercise per Session: Not on file  Stress: Stress Concern Present (01/06/2024)   Received from [facility] - Occupational Stress Questionnaire    Feeling of Stress : To some extent  Social Connections: Moderately Integrated (01/06/2024)   Received from [facility]   Social Network    How would you rate your social network (family, work, friends)?: Adequate participation with social networks  Intimate Partner Violence: Not At Risk (01/06/2024)   Received from [facility]   HITS    Over the last 12 months how often did your partner physically hurt you?: Never    Over the last 12 months how often did your partner insult you or talk down to you?: Never    Over the last 12 months how often did your partner threaten you with physical harm?: Never    Over the last 12 months how often did your partner scream or curse at you?: Never    Family History:  Family History  Problem Relation Age of Onset   Hypertension Mother    Stroke Mother    Stroke Father    Stroke Sister    Stroke Daughter     Medications:   Current Outpatient Medications on File Prior to Visit  Medication Sig Dispense Refill   albuterol  (VENTOLIN  HFA) 108 (90 Base) MCG/ACT inhaler Inhale 2 puffs into the lungs as needed for wheezing or shortness of breath.     amLODipine (NORVASC) 10 MG tablet Take 10 mg by mouth daily.     aspirin  EC 81 MG tablet Take 81 mg by mouth daily.     Calcium-Magnesium -Vitamin D (CALCIUM 1200+D3 PO) Take by mouth.     carvedilol  (COREG ) 6.25 MG tablet Take 6.25 mg by mouth in  the morning and at bedtime.     Cetirizine HCl 10 MG CAPS      clopidogrel  (PLAVIX ) 75 MG tablet Take 75 mg by mouth daily.     Cyanocobalamin  (VITAMIN B-12 PO) Take 1 capsule by mouth daily.     levETIRAcetam  (KEPPRA ) 250 MG tablet TAKE 1 TABLET BY MOUTH TWICE DAILY 60 tablet 8   losartan  (COZAAR ) 50 MG tablet Take 50 mg by mouth daily. (Patient taking differently: Take 100 mg by mouth daily.)     metFORMIN (GLUCOPHAGE) 500 MG tablet Take 500 mg by mouth in the morning and at bedtime.  Omega-3 Fatty Acids (FISH OIL PO) Take 2 capsules by mouth daily.     pantoprazole  (PROTONIX ) 40 MG tablet Take 40 mg by mouth daily.     simvastatin  (ZOCOR ) 20 MG tablet Take 20 mg by mouth at bedtime.  1   Ascorbic Acid  (VITAMIN C  PO) Take 1 tablet by mouth daily. (Patient not taking: Reported on 03/07/2024)     Multiple Vitamin (MULTIVITAMIN WITH MINERALS) TABS tablet Take 1 tablet by mouth daily. (Patient not taking: Reported on 03/07/2024)     No current facility-administered medications on file prior to visit.    Allergies:  No Known Allergies    OBJECTIVE:  Physical Exam  Vitals:   03/07/24 1517  BP: (!) 142/66  Pulse: 60  Weight: 198 lb 3.2 oz (89.9 kg)  Height: 5' 4 (1.626 m)   Body mass index is 34.02 kg/m. No results found.   General: well developed, well nourished, very pleasant elderly female, seated, in no evident distress  Neurologic Exam Mental Status: Awake and fully alert.  Fluent speech and language.  Oriented to place and time. Recent and remote memory intact. Attention span, concentration and fund of knowledge appropriate. Mood and affect appropriate.  Cranial Nerves: Pupils equal, briskly reactive to light. Extraocular movements full without nystagmus. Visual fields full to confrontation. Hearing intact. Facial sensation intact. Face, tongue, palate moves normally and symmetrically.  Motor: Normal bulk and tone. Normal strength in all tested extremity muscles except  mild RUE weakness due to chronic shoulder pain with limited ROM Sensory.: intact to touch , pinprick , position and vibratory sensation.  Coordination: Rapid alternating movements normal in all extremities. Finger-to-nose and heel-to-shin performed accurately bilaterally. Gait and Station: Arises from chair with mild difficulty. Stance is hunched. Gait demonstrates slow cautious steps with antalgic gait and use of cane       ASSESSMENT/PLAN: [name] is a 89 y.o. year old female with transient slurred speech and dysphagia in 03/2023 and slurred speech and right-sided weakness 06/2023 of unclear etiology.  Extensive workup without clear cause.      Transient neurological symptoms (TIA vs partial seizure):  No recurrent symptoms Started keppra  250mg  BID 08/2023, will continue for now. Can consider tapering off at f/u visit if no recurrent episodes  Continue aspirin  and simvastatin  managed/prescribed by PCP for stroke prevention measures and continue close PCP follow-up for aggressive stroke risk factor management with multiple vascular risk factors EEG 09/2023 no epileptiform discharges     Follow up in 6-8 months or call earlier if needed   CC:  PCP: Teresa Aldona CROME, NP    I personally spent a total of 25 minutes in the care of the patient today including preparing to see the patient, performing a medically appropriate exam/evaluation, counseling and educating, placing orders, and documenting clinical information in the EHR.     Harlene Bogaert, AGNP-BC  [facility] [address] [place], [place] [address]  Phone [phone] Fax [phone] Note: This document was prepared with digital dictation and possible smart phrase technology. Any transcriptional errors that result from this process are unintentional.

## 2024-10-24 ENCOUNTER — Ambulatory Visit: Admitting: Adult Health
